# Patient Record
Sex: Female | Born: 1955 | Race: White | Hispanic: No | Marital: Married | Smoking: Never smoker
Health system: Southern US, Community
[De-identification: ages and names within clinical notes are randomized; demographics above are authoritative.]

## PROBLEM LIST (undated history)

## (undated) DIAGNOSIS — M810 Age-related osteoporosis without current pathological fracture: Secondary | ICD-10-CM

## (undated) DIAGNOSIS — E039 Hypothyroidism, unspecified: Secondary | ICD-10-CM

## (undated) HISTORY — DX: Age-related osteoporosis without current pathological fracture: M81.0

## (undated) HISTORY — DX: Hypothyroidism, unspecified: E03.9

---
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
## 1997-05-06 HISTORY — PX: CHOLECYSTECTOMY: SHX55

## 2000-10-09 ENCOUNTER — Encounter: Payer: Self-pay | Admitting: Family Medicine

## 2000-10-09 ENCOUNTER — Ambulatory Visit (HOSPITAL_COMMUNITY): Admission: RE | Admit: 2000-10-09 | Discharge: 2000-10-09 | Payer: Self-pay | Admitting: *Deleted

## 2001-03-17 ENCOUNTER — Other Ambulatory Visit: Admission: RE | Admit: 2001-03-17 | Discharge: 2001-03-17 | Payer: Self-pay | Admitting: Obstetrics and Gynecology

## 2001-09-03 ENCOUNTER — Ambulatory Visit (HOSPITAL_COMMUNITY): Admission: RE | Admit: 2001-09-03 | Discharge: 2001-09-03 | Payer: Self-pay | Admitting: Family Medicine

## 2001-09-03 ENCOUNTER — Encounter: Payer: Self-pay | Admitting: Family Medicine

## 2001-10-12 ENCOUNTER — Encounter: Payer: Self-pay | Admitting: Family Medicine

## 2001-10-12 ENCOUNTER — Ambulatory Visit (HOSPITAL_COMMUNITY): Admission: RE | Admit: 2001-10-12 | Discharge: 2001-10-12 | Payer: Self-pay | Admitting: Family Medicine

## 2001-11-25 ENCOUNTER — Ambulatory Visit (HOSPITAL_COMMUNITY): Admission: RE | Admit: 2001-11-25 | Discharge: 2001-11-25 | Payer: Self-pay | Admitting: Endocrinology

## 2002-10-14 ENCOUNTER — Ambulatory Visit (HOSPITAL_COMMUNITY): Admission: RE | Admit: 2002-10-14 | Discharge: 2002-10-14 | Payer: Self-pay | Admitting: Family Medicine

## 2002-10-14 ENCOUNTER — Encounter: Payer: Self-pay | Admitting: Family Medicine

## 2003-10-07 ENCOUNTER — Encounter (HOSPITAL_COMMUNITY): Admission: RE | Admit: 2003-10-07 | Discharge: 2003-11-06 | Payer: Self-pay | Admitting: Orthopaedic Surgery

## 2003-10-19 ENCOUNTER — Ambulatory Visit (HOSPITAL_COMMUNITY): Admission: RE | Admit: 2003-10-19 | Discharge: 2003-10-19 | Payer: Self-pay | Admitting: Family Medicine

## 2003-11-04 ENCOUNTER — Ambulatory Visit (HOSPITAL_COMMUNITY): Admission: RE | Admit: 2003-11-04 | Discharge: 2003-11-04 | Payer: Self-pay | Admitting: Orthopaedic Surgery

## 2003-11-04 ENCOUNTER — Encounter: Payer: Self-pay | Admitting: Orthopaedic Surgery

## 2004-10-22 ENCOUNTER — Ambulatory Visit (HOSPITAL_COMMUNITY): Admission: RE | Admit: 2004-10-22 | Discharge: 2004-10-22 | Payer: Self-pay | Admitting: Family Medicine

## 2005-10-23 ENCOUNTER — Ambulatory Visit (HOSPITAL_COMMUNITY): Admission: RE | Admit: 2005-10-23 | Discharge: 2005-10-23 | Payer: Self-pay | Admitting: Family Medicine

## 2006-10-27 ENCOUNTER — Ambulatory Visit (HOSPITAL_COMMUNITY): Admission: RE | Admit: 2006-10-27 | Discharge: 2006-10-27 | Payer: Self-pay | Admitting: Family Medicine

## 2007-05-15 ENCOUNTER — Ambulatory Visit (HOSPITAL_COMMUNITY): Admission: RE | Admit: 2007-05-15 | Discharge: 2007-05-15 | Payer: Self-pay | Admitting: Gastroenterology

## 2007-05-15 ENCOUNTER — Ambulatory Visit: Payer: Self-pay | Admitting: Gastroenterology

## 2007-10-28 ENCOUNTER — Ambulatory Visit (HOSPITAL_COMMUNITY): Admission: RE | Admit: 2007-10-28 | Discharge: 2007-10-28 | Payer: Self-pay | Admitting: Family Medicine

## 2007-11-23 ENCOUNTER — Ambulatory Visit (HOSPITAL_COMMUNITY): Admission: RE | Admit: 2007-11-23 | Discharge: 2007-11-23 | Payer: Self-pay | Admitting: Family Medicine

## 2008-03-08 ENCOUNTER — Ambulatory Visit (HOSPITAL_COMMUNITY): Admission: RE | Admit: 2008-03-08 | Discharge: 2008-03-08 | Payer: Self-pay | Admitting: Family Medicine

## 2008-11-01 ENCOUNTER — Ambulatory Visit (HOSPITAL_COMMUNITY): Admission: RE | Admit: 2008-11-01 | Discharge: 2008-11-01 | Payer: Self-pay | Admitting: Family Medicine

## 2009-06-12 ENCOUNTER — Encounter: Payer: Self-pay | Admitting: Orthopedic Surgery

## 2009-06-19 ENCOUNTER — Ambulatory Visit: Payer: Self-pay | Admitting: Orthopedic Surgery

## 2009-06-19 DIAGNOSIS — G5600 Carpal tunnel syndrome, unspecified upper limb: Secondary | ICD-10-CM | POA: Insufficient documentation

## 2009-06-19 DIAGNOSIS — M7580 Other shoulder lesions, unspecified shoulder: Secondary | ICD-10-CM

## 2009-06-19 DIAGNOSIS — M25819 Other specified joint disorders, unspecified shoulder: Secondary | ICD-10-CM | POA: Insufficient documentation

## 2009-11-02 ENCOUNTER — Ambulatory Visit (HOSPITAL_COMMUNITY): Admission: RE | Admit: 2009-11-02 | Discharge: 2009-11-02 | Payer: Self-pay | Admitting: Family Medicine

## 2010-05-27 ENCOUNTER — Encounter: Payer: Self-pay | Admitting: Family Medicine

## 2010-06-05 NOTE — Assessment & Plan Note (Signed)
Summary: RT SHOULDER PAIN RADIATES DOWNW/NEEDS XRAY/UMR/CAF   Vital Signs:  Patient profile:   55 year old female Height:      63 inches Temp:     98.7 degrees F Pulse rate:   72 / minute  Visit Type:  new  CC:  right shoulder pain.[name]  History of Present Illness: I saw Sandra Zavala in the office today for an initial visit.  She is a 55 years old woman with the complaint of:  RIGHT SHOULDER PAIN.  The patient complains of RIGHT shoulder pain and some numbness in her RIGHT hand.  She is having throbbing pain which is a 7 on a scale of 10 it is intermittent it happens with no specific temporal findings.  It was gradual in onset it is improved with limited movement and worse with reaching overhead she does complain of numbness and tingling in the RIGHT hand which is worse at night  She has a history of a bulging disc diagnosed in 2005 that was treated with exercise  The pain has been present for 3-4 months it stays above the elbow she denies any neck pain  Xrays today.  Allergies (verified): No Known Drug Allergies  Past History:  Past Medical History: Thyroid Osteoporosis  Past Surgical History: Cholecystectomy Tonsillectomy  Review of Systems General:  Denies weight loss, weight gain, fever, chills, and fatigue. Cardiac :  Denies chest pain, angina, heart attack, heart failure, poor circulation, blood clots, and phlebitis. Resp:  Denies short of breath, difficulty breathing, COPD, cough, and pneumonia. GI:  Denies nausea, vomiting, diarrhea, constipation, difficulty swallowing, ulcers, GERD, and reflux. GU:  Denies kidney failure, kidney transplant, kidney stones, burning, poor stream, testicular cancer, blood in urine, and . Neuro:  Complains of numbness; denies headache, dizziness, migraines, weakness, tremor, and unsteady walking; tingling. MS:  Denies joint pain, rheumatoid arthritis, joint swelling, gout, bone cancer, osteoporosis, and . Endo:  Denies thyroid disease,  goiter, and diabetes. Psych:  Denies depression, mood swings, anxiety, panic attack, bipolar, and schizophrenia. Derm:  Denies eczema, cancer, and itching. EENT:  Denies poor vision, cataracts, glaucoma, poor hearing, vertigo, ears ringing, sinusitis, hoarseness, toothaches, and bleeding gums. Immunology:  Denies seasonal allergies, sinus problems, and allergic to bee stings. Lymphatic:  Denies lymph node cancer and lymph edema.  Physical Exam  Additional Exam:  GEN: well developed, well nourished, normal grooming and hygiene, no deformity and normal body habitus.   CDV: pulses are normal, no edema, no erythema. no tenderness  Lymph: normal lymph nodes   Skin: no rashes, skin lesions or open sores   NEURO: normal coordination, reflexes, sensation.   Psyche: awake, alert and oriented. Mood normal   Gait: normal  Neck inspection reveals minimal to no tenderness with full range of motion normal muscle tone and no joint laxity  LEFT shoulder has full range of motion grade 5 strength no joint laxity and no tenderness  RIGHT shoulder is tender over the anterolateral deltoid with unrestricted motion but painful motion in extremes of forward elevation and internal rotation.  Has grade 5 strength.  She has a positive impingement sign.  She has no joint laxity.  The RIGHT upper extremity there is tenderness over the carpal tunnel a positive carpal tunnel compression test and decreased sensation is noted with the compression test in the thumb long and index finger     Impression & Recommendations:  Problem # 1:  IMPINGEMENT SYNDROME (ICD-726.2) Assessment New  RIGHT shoulder injection,   Verbal consent  obtained/The shoulder was injected with depomedrol 40mg /cc and sensorcaine .25% . There were no complications  AP lateral RIGHT shoulder 2 views of the glenohumeral joint   The glenohumeral joint space is normal. The bony anatomy is without bone lesion. The acromion is a Type  II Normal shoulder  Orders: New Patient Level IV (98119)  Problem # 2:  CARPAL TUNNEL SYNDROME (ICD-354.0) Assessment: New  recommend carpal tunnel splint  Neurontin 100 mg q.h.s.  Orders: New Patient Level IV (14782) Shoulder x-ray,  minimum 2 views (95621)  Patient Instructions: 1)  Carpal tunnel splint at night  2)  You have received an injection of cortisone today. You may experience increased pain at the injection site. Apply ice pack to the area for 20 minutes every 2 hours and take 2 xtra strength tylenol every 8 hours. This increased pain will usually resolve in 24 hours. The injection will take effect in 3-10 days.  3)  Limit activity to comfort and avoid activities that increase discomfort.  Apply moist heat and/or ice to shoulder and take medication as instructed for pain relief. Please read the Shoulder Pain Handout and start exercises as directed  4)  Please schedule a follow-up appointment in 2 months. [cancel if you are not having any more pain]

## 2010-06-05 NOTE — Letter (Signed)
Summary: Historic Patient File  Historic Patient File   Imported By: Elvera Maria 06/27/2009 11:21:52  _____________________________________________________________________  External Attachment:    Type:   Image     Comment:   history form

## 2010-09-18 NOTE — Op Note (Signed)
NAME:  Sandra Zavala, Sandra Zavala                ACCOUNT NO.:  [id number]   MEDICAL RECORD NO.:  [id number]          PATIENT TYPE:  AMB   LOCATION:  DAY                           FACILITY:  [facility]   PHYSICIAN:  Kassie Mends, M.D.      DATE OF BIRTH:  26-Mar-1956   DATE OF PROCEDURE:  05/15/2007  DATE OF DISCHARGE:                               OPERATIVE REPORT   REFERRING PHYSICIAN:  Lilyan Punt, MD   PROCEDURE:  Colonoscopy.   INDICATION FOR EXAM:  Sandra Zavala is a 55 year old female who presents  for average risk colon cancer screening.   FINDINGS:  Rare sigmoid diverticula.  Small internal hemorrhoids.  She  required multiple changes in position as well as abdominal pressure to  achieve successful intubation of the cecum.  Otherwise, no polyps,  masses, inflammatory changes or AVMs seen.   RECOMMENDATIONS:  1. Screening colonoscopy in 10 years.  2. She should follow a high fiber diet.  She was given a handout on a      high-fiber diet and diverticulosis.   MEDICATIONS:  1. Demerol 75 mg IV.  2. Versed 5 mg IV.   PROCEDURE TECHNIQUE:  Physical exam was performed.  Informed consent was  obtained from the patient after explaining the benefits, risks and  alternatives to the procedure.  The patient was connected to the monitor  and placed in the left lateral position.  Continuous oxygen was provided  by nasal cannula and IV medicine administered through an indwelling  cannula.  After administration of sedation and rectal exam, the  patient's rectum was intubated.  The scope was advanced under direct  visualization to the cecum.  The scope was removed slowly by carefully  examining the color, texture, anatomy and integrity of the mucosa on the  way out.  Sandra Zavala has an angulation at  the rectosigmoid area which required her to be in the supine position  during the majority of the colonoscopy in order to successfully intubate  the cecum. The patient was recovered in endoscopy and discharged  home in  satisfactory condition.      Kassie Mends, M.D.  Electronically Signed     SM/MEDQ  D:  05/15/2007  T:  05/15/2007  Job:  [id number]   cc:   Lorin Picket A. Gerda Diss, MD  Fax: [phone]

## 2010-09-24 ENCOUNTER — Other Ambulatory Visit: Payer: Self-pay | Admitting: Nurse Practitioner

## 2010-09-24 DIAGNOSIS — Z139 Encounter for screening, unspecified: Secondary | ICD-10-CM

## 2010-11-06 ENCOUNTER — Ambulatory Visit (HOSPITAL_COMMUNITY)
Admission: RE | Admit: 2010-11-06 | Discharge: 2010-11-06 | Disposition: A | Discharge status: Home / Self Care | Payer: 59 | Source: Ambulatory Visit | Attending: Nurse Practitioner | Admitting: Nurse Practitioner

## 2010-11-06 DIAGNOSIS — Z1231 Encounter for screening mammogram for malignant neoplasm of breast: Secondary | ICD-10-CM | POA: Insufficient documentation

## 2010-11-06 DIAGNOSIS — Z139 Encounter for screening, unspecified: Secondary | ICD-10-CM

## 2011-06-18 ENCOUNTER — Other Ambulatory Visit: Payer: Self-pay | Admitting: Family Medicine

## 2011-06-24 ENCOUNTER — Other Ambulatory Visit (HOSPITAL_COMMUNITY): Payer: 59

## 2011-06-25 ENCOUNTER — Other Ambulatory Visit (HOSPITAL_COMMUNITY): Payer: 59

## 2011-06-27 ENCOUNTER — Ambulatory Visit (HOSPITAL_COMMUNITY)
Admission: RE | Admit: 2011-06-27 | Discharge: 2011-06-27 | Disposition: A | Discharge status: Home / Self Care | Payer: 59 | Source: Ambulatory Visit | Attending: Family Medicine | Admitting: Family Medicine

## 2011-06-27 DIAGNOSIS — M818 Other osteoporosis without current pathological fracture: Secondary | ICD-10-CM | POA: Insufficient documentation

## 2011-06-27 DIAGNOSIS — Z780 Asymptomatic menopausal state: Secondary | ICD-10-CM | POA: Insufficient documentation

## 2011-10-08 ENCOUNTER — Other Ambulatory Visit: Payer: Self-pay | Admitting: Nurse Practitioner

## 2011-10-08 DIAGNOSIS — Z139 Encounter for screening, unspecified: Secondary | ICD-10-CM

## 2011-11-11 ENCOUNTER — Ambulatory Visit (HOSPITAL_COMMUNITY)
Admission: RE | Admit: 2011-11-11 | Discharge: 2011-11-11 | Disposition: A | Discharge status: Home / Self Care | Payer: 59 | Source: Ambulatory Visit | Attending: Nurse Practitioner | Admitting: Nurse Practitioner

## 2011-11-11 DIAGNOSIS — Z139 Encounter for screening, unspecified: Secondary | ICD-10-CM

## 2011-11-11 DIAGNOSIS — Z1231 Encounter for screening mammogram for malignant neoplasm of breast: Secondary | ICD-10-CM | POA: Insufficient documentation

## 2012-10-27 ENCOUNTER — Other Ambulatory Visit: Payer: Self-pay | Admitting: Nurse Practitioner

## 2012-10-27 DIAGNOSIS — Z139 Encounter for screening, unspecified: Secondary | ICD-10-CM

## 2012-11-12 ENCOUNTER — Ambulatory Visit (HOSPITAL_COMMUNITY)
Admission: RE | Admit: 2012-11-12 | Discharge: 2012-11-12 | Disposition: A | Discharge status: Home / Self Care | Payer: 59 | Source: Ambulatory Visit | Attending: Nurse Practitioner | Admitting: Nurse Practitioner

## 2012-11-12 DIAGNOSIS — Z139 Encounter for screening, unspecified: Secondary | ICD-10-CM

## 2012-11-12 DIAGNOSIS — Z1231 Encounter for screening mammogram for malignant neoplasm of breast: Secondary | ICD-10-CM | POA: Insufficient documentation

## 2012-11-13 ENCOUNTER — Other Ambulatory Visit: Payer: Self-pay | Admitting: Nurse Practitioner

## 2012-12-02 ENCOUNTER — Encounter: Payer: Self-pay | Admitting: Nurse Practitioner

## 2012-12-02 ENCOUNTER — Ambulatory Visit (INDEPENDENT_AMBULATORY_CARE_PROVIDER_SITE_OTHER): Payer: 59 | Admitting: Nurse Practitioner

## 2012-12-02 VITALS — BP 130/84 | Ht 59.25 in | Wt 178.4 lb

## 2012-12-02 DIAGNOSIS — R300 Dysuria: Secondary | ICD-10-CM

## 2012-12-02 DIAGNOSIS — Z0000 Encounter for general adult medical examination without abnormal findings: Secondary | ICD-10-CM

## 2012-12-02 DIAGNOSIS — M810 Age-related osteoporosis without current pathological fracture: Secondary | ICD-10-CM

## 2012-12-02 DIAGNOSIS — Z01419 Encounter for gynecological examination (general) (routine) without abnormal findings: Secondary | ICD-10-CM

## 2012-12-02 DIAGNOSIS — E039 Hypothyroidism, unspecified: Secondary | ICD-10-CM

## 2012-12-02 LAB — POCT URINALYSIS DIPSTICK: pH, UA: 7

## 2012-12-02 LAB — POCT UA - MICROSCOPIC ONLY: Mucus, UA: 0

## 2012-12-02 MED ORDER — SULFAMETHOXAZOLE-TMP DS 800-160 MG PO TABS: 1.0000 | ORAL_TABLET | Freq: Two times a day (BID) | ORAL | Status: DC

## 2012-12-02 NOTE — Patient Instructions (Signed)
Luvena 2-3 x per week for vaginal health

## 2012-12-02 NOTE — Progress Notes (Addendum)
  Subjective:    Patient ID: Sandra Zavala, female    DOB: 1955-08-10, 57 y.o.   MRN: [id number]  HPI  presents for her wellness checkup. Just had her mammogram done. Her last bone density was 06/27/2011. Regular eye exams. Regular dental exams. No vaginal discharge pelvic pain or fever. Same sexual partner. No vaginal bleeding. Complaints of left pelvic/flank pain for the past few weeks. Has noticed a change in her urinary stream at times. No gross hematuria. Symptoms occur about several times per day but not every day. Wonders if it could be a kidney stone. At this time no pain consistent with an active kidney stone.    Review of Systems  Constitutional: Negative for fever, activity change, appetite change and fatigue.  HENT: Negative for hearing loss, sore throat, rhinorrhea, dental problem and postnasal drip.   Eyes: Negative for visual disturbance.  Respiratory: Negative for chest tightness and shortness of breath.   Cardiovascular: Negative for chest pain, palpitations and leg swelling.  Gastrointestinal: Negative for nausea, vomiting, diarrhea, constipation, blood in stool and abdominal distention.  Genitourinary: Negative for dysuria, urgency, frequency, vaginal bleeding, vaginal discharge, enuresis, difficulty urinating, menstrual problem and pelvic pain.  Neurological: Negative for headaches.  Psychiatric/Behavioral: Negative for sleep disturbance and dysphoric mood. The patient is not nervous/anxious.        Objective:   Physical Exam  Vitals reviewed. Constitutional: She is oriented to person, place, and time. She appears well-developed. No distress.  HENT:  Right Ear: External ear normal.  Left Ear: External ear normal.  Mouth/Throat: Oropharynx is clear and moist.  Neck: Normal range of motion. Neck supple. No tracheal deviation present. No thyromegaly present.  Cardiovascular: Normal rate, regular rhythm and normal heart sounds.  Exam reveals no gallop.   No murmur  heard. Pulmonary/Chest: Effort normal and breath sounds normal.  Abdominal: Soft. She exhibits no distension and no mass. There is tenderness. There is no rebound and no guarding.  Genitourinary: Uterus normal.  Musculoskeletal: She exhibits no edema.  Lymphadenopathy:    She has no cervical adenopathy.  Neurological: She is alert and oriented to person, place, and time.  Skin: Skin is warm and dry. No rash noted.  Psychiatric: She has a normal mood and affect. Her behavior is normal.   Abdominal exam: Mild tenderness with deep palpation towards the left lower quadrant and into the anterior flank area. Minimal left CVA area tenderness. External GU normal limit. Bimanual exam normal limit. Rectal exam normal, no stool for Hemoccult. Urine microscopic 5-10 wbc's, rare epithelial cell. Breast exam: no masses noted; axillae no adenopathy.        Assessment & Plan:  Well woman exam - Plan: POC Hemoccult Bld/Stl (3-Cd Home Screen)  Dysuria - Plan: POCT urinalysis dipstick, POCT UA - Microscopic Only, Urine culture  Osteoporosis, unspecified - Plan: Vitamin D 25 hydroxy  Hypothyroidism - Plan: TSH  Routine general medical examination at a health care facility - Plan: Basic metabolic panel, Hepatic function panel, Lipid panel  Continue vitamin D supplementation. Bactrim DS 1 by mouth twice a day x7 days. Urine sent for culture and sensitivity. Recommend regular exercise and healthy diet. Recheck if abdominal symptoms persist.

## 2012-12-04 ENCOUNTER — Encounter: Payer: Self-pay | Admitting: Nurse Practitioner

## 2012-12-04 LAB — LIPID PANEL
Cholesterol: 167 mg/dL (ref 0–200)
HDL: 55 mg/dL (ref >39)
VLDL: 13 mg/dL (ref 0–40)

## 2012-12-04 LAB — VITAMIN D 25 HYDROXY (VIT D DEFICIENCY, FRACTURES): Vit D, 25-Hydroxy: 64 ng/mL (ref 30–89)

## 2012-12-04 LAB — URINE CULTURE
Colony Count: NO GROWTH
Organism ID, Bacteria: NO GROWTH

## 2012-12-04 LAB — BASIC METABOLIC PANEL
BUN: 16 mg/dL (ref 6–23)
CO2: 26 mEq/L (ref 19–32)
Chloride: 105 mEq/L (ref 96–112)
Creat: 0.92 mg/dL (ref 0.50–1.10)

## 2012-12-04 LAB — HEPATIC FUNCTION PANEL: Alkaline Phosphatase: 96 U/L (ref 39–117)

## 2012-12-04 LAB — TSH: TSH: 2.995 u[IU]/mL (ref 0.350–4.500)

## 2012-12-05 ENCOUNTER — Encounter: Payer: Self-pay | Admitting: Nurse Practitioner

## 2012-12-05 DIAGNOSIS — M810 Age-related osteoporosis without current pathological fracture: Secondary | ICD-10-CM | POA: Insufficient documentation

## 2012-12-07 ENCOUNTER — Other Ambulatory Visit: Payer: Self-pay | Admitting: *Deleted

## 2012-12-07 MED ORDER — LEVOTHYROXINE SODIUM 125 MCG PO TABS: 125.0000 ug | ORAL_TABLET | Freq: Every day | ORAL | Status: DC

## 2012-12-24 ENCOUNTER — Other Ambulatory Visit: Payer: Self-pay | Admitting: Nurse Practitioner

## 2013-01-20 ENCOUNTER — Other Ambulatory Visit (INDEPENDENT_AMBULATORY_CARE_PROVIDER_SITE_OTHER): Payer: 59 | Admitting: *Deleted

## 2013-01-20 DIAGNOSIS — Z0000 Encounter for general adult medical examination without abnormal findings: Secondary | ICD-10-CM

## 2013-01-20 DIAGNOSIS — Z01419 Encounter for gynecological examination (general) (routine) without abnormal findings: Secondary | ICD-10-CM

## 2013-01-20 LAB — POC HEMOCCULT BLD/STL (HOME/3-CARD/SCREEN): Card #3 Fecal Occult Blood, POC: NEGATIVE

## 2013-01-26 ENCOUNTER — Ambulatory Visit (INDEPENDENT_AMBULATORY_CARE_PROVIDER_SITE_OTHER): Payer: 59 | Admitting: Urology

## 2013-01-26 DIAGNOSIS — R1032 Left lower quadrant pain: Secondary | ICD-10-CM

## 2013-01-26 DIAGNOSIS — N200 Calculus of kidney: Secondary | ICD-10-CM

## 2013-01-27 ENCOUNTER — Other Ambulatory Visit: Payer: Self-pay | Admitting: Urology

## 2013-01-27 DIAGNOSIS — N200 Calculus of kidney: Secondary | ICD-10-CM

## 2013-01-28 ENCOUNTER — Ambulatory Visit (HOSPITAL_COMMUNITY)
Admission: RE | Admit: 2013-01-28 | Discharge: 2013-01-28 | Disposition: A | Discharge status: Home / Self Care | Payer: 59 | Source: Ambulatory Visit | Attending: Urology | Admitting: Urology

## 2013-01-28 DIAGNOSIS — R109 Unspecified abdominal pain: Secondary | ICD-10-CM | POA: Insufficient documentation

## 2013-01-28 DIAGNOSIS — N200 Calculus of kidney: Secondary | ICD-10-CM

## 2013-02-22 ENCOUNTER — Encounter: Payer: Self-pay | Admitting: Nurse Practitioner

## 2013-02-22 ENCOUNTER — Ambulatory Visit (INDEPENDENT_AMBULATORY_CARE_PROVIDER_SITE_OTHER): Payer: 59 | Admitting: Nurse Practitioner

## 2013-02-22 VITALS — BP 132/88 | Temp 98.6°F | Ht 60.0 in | Wt 178.0 lb

## 2013-02-22 DIAGNOSIS — J309 Allergic rhinitis, unspecified: Secondary | ICD-10-CM

## 2013-02-22 DIAGNOSIS — J329 Chronic sinusitis, unspecified: Secondary | ICD-10-CM

## 2013-02-22 DIAGNOSIS — J300 Vasomotor rhinitis: Secondary | ICD-10-CM

## 2013-02-22 MED ORDER — AMOXICILLIN-POT CLAVULANATE 875-125 MG PO TABS: 1.0000 | ORAL_TABLET | Freq: Two times a day (BID) | ORAL | Status: DC

## 2013-02-22 MED ORDER — METHYLPREDNISOLONE ACETATE 40 MG/ML IJ SUSP
40.0000 mg | Freq: Once | INTRAMUSCULAR | Status: AC
  Administered 2013-02-22: 40 mg via INTRAMUSCULAR

## 2013-02-22 NOTE — Patient Instructions (Signed)
Nasacort AQ as directed 

## 2013-02-26 ENCOUNTER — Encounter: Payer: Self-pay | Admitting: Nurse Practitioner

## 2013-02-26 NOTE — Progress Notes (Signed)
Subjective:  Presents with complaints of head congestion and runny nose for the past 6 weeks. Over the past several days has developed symptoms of a sinus infection. No fever. Facial area headache. Occasional cough. No wheezing. Ear pain. Sore throat mainly at night and first thing in the morning. No relief with OTC meds.  Objective:   BP 132/88  Temp(Src) 98.6 F (37 C) (Oral)  Ht 5' (1.524 m)  Wt 178 lb (80.74 kg)  BMI 34.76 kg/m2 NAD. Alert, oriented. TMs retracted, no erythema. Pharynx injected with PND noted. Neck supple with mild soft slightly tender adenopathy. Lungs clear. Heart regular rate rhythm.  Assessment:Vasomotor rhinitis - Plan: methylPREDNISolone acetate (DEPO-MEDROL) injection 40 mg  Rhinosinusitis  Plan: Meds ordered this encounter  Medications  . amoxicillin-clavulanate (AUGMENTIN) 875-125 MG per tablet    Sig: Take 1 tablet by mouth 2 (two) times daily.    Dispense:  20 tablet    Refill:  0    Order Specific Question:  Supervising Provider    Answer:  Merlyn Albert [[id number]]  . methylPREDNISolone acetate (DEPO-MEDROL) injection 40 mg    Sig:    OTC antihistamines and Nasacort AQ as directed. Call back by the end of the week if no improvement, sooner if worse.

## 2013-03-04 ENCOUNTER — Encounter: Payer: Self-pay | Admitting: Nurse Practitioner

## 2013-03-08 ENCOUNTER — Encounter: Payer: Self-pay | Admitting: Nurse Practitioner

## 2013-03-08 ENCOUNTER — Other Ambulatory Visit: Payer: Self-pay | Admitting: Nurse Practitioner

## 2013-03-08 NOTE — Telephone Encounter (Signed)
[place] bilat axillary for masses

## 2013-03-09 ENCOUNTER — Other Ambulatory Visit: Payer: Self-pay | Admitting: *Deleted

## 2013-03-09 ENCOUNTER — Ambulatory Visit (HOSPITAL_COMMUNITY): Payer: 59

## 2013-03-09 ENCOUNTER — Ambulatory Visit (HOSPITAL_COMMUNITY)
Admission: RE | Admit: 2013-03-09 | Discharge: 2013-03-09 | Disposition: A | Discharge status: Home / Self Care | Payer: 59 | Source: Ambulatory Visit | Attending: Nurse Practitioner | Admitting: Nurse Practitioner

## 2013-03-09 DIAGNOSIS — R229 Localized swelling, mass and lump, unspecified: Secondary | ICD-10-CM | POA: Insufficient documentation

## 2013-03-09 DIAGNOSIS — R2233 Localized swelling, mass and lump, upper limb, bilateral: Secondary | ICD-10-CM

## 2013-03-10 ENCOUNTER — Encounter: Payer: Self-pay | Admitting: Nurse Practitioner

## 2013-03-11 ENCOUNTER — Other Ambulatory Visit: Payer: Self-pay

## 2013-04-14 ENCOUNTER — Other Ambulatory Visit: Payer: Self-pay | Admitting: Nurse Practitioner

## 2013-05-18 ENCOUNTER — Telehealth: Payer: Self-pay | Admitting: Family Medicine

## 2013-05-18 MED ORDER — ALENDRONATE SODIUM 70 MG PO TABS: ORAL_TABLET | ORAL | Status: DC

## 2013-05-18 NOTE — Telephone Encounter (Signed)
Patient needs Rx for alendronate 70 mg 1 weekly, would like 3 month supply [place]

## 2013-05-18 NOTE — Telephone Encounter (Signed)
Medication sent to pharmacy and patient was notified. 

## 2013-07-21 ENCOUNTER — Ambulatory Visit (INDEPENDENT_AMBULATORY_CARE_PROVIDER_SITE_OTHER): Payer: 59 | Admitting: Nurse Practitioner

## 2013-07-21 ENCOUNTER — Encounter: Payer: Self-pay | Admitting: Nurse Practitioner

## 2013-07-21 VITALS — BP 128/82 | Temp 98.2°F | Ht 60.0 in | Wt 181.0 lb

## 2013-07-21 DIAGNOSIS — J322 Chronic ethmoidal sinusitis: Secondary | ICD-10-CM

## 2013-07-21 MED ORDER — AMOXICILLIN-POT CLAVULANATE 875-125 MG PO TABS: 1.0000 | ORAL_TABLET | Freq: Two times a day (BID) | ORAL | Status: DC

## 2013-07-21 MED ORDER — PREDNISONE 20 MG PO TABS: ORAL_TABLET | ORAL | Status: DC

## 2013-07-21 MED ORDER — METHYLPREDNISOLONE ACETATE 40 MG/ML IJ SUSP
40.0000 mg | Freq: Once | INTRAMUSCULAR | Status: AC
  Administered 2013-07-21: 40 mg via INTRAMUSCULAR

## 2013-07-26 ENCOUNTER — Encounter: Payer: Self-pay | Admitting: Nurse Practitioner

## 2013-07-26 NOTE — Progress Notes (Signed)
Subjective:  Presents for complaints of sinus symptoms over the past week. No fever but having chills. Ethmoid sinus area headache. No cough. Intense head congestion. Producing yellow green mucus for the past 3 days. No sore throat. Pressure in the right ear. No relief with Nasacort and Mucinex.  Objective:   BP 128/82  Temp(Src) 98.2 F (36.8 C)  Ht 5' (1.524 m)  Wt 181 lb (82.101 kg)  BMI 35.35 kg/m2 NAD. Alert, oriented. TMs clear effusion, no erythema. Nasal mucosa pale and boggy. Pharynx injected with PND noted. Neck supple with mild soft  adenopathy. Lungs clear. Heart regular rate rhythm.  Assessment:Ethmoid sinusitis - Plan: methylPREDNISolone acetate (DEPO-MEDROL) injection 40 mg  Plan: Meds ordered this encounter  Medications  . amoxicillin-clavulanate (AUGMENTIN) 875-125 MG per tablet    Sig: Take 1 tablet by mouth 2 (two) times daily.    Dispense:  20 tablet    Refill:  0    Order Specific Question:  Supervising Provider    Answer:  Mikey Kirschner [[id number]]  . predniSONE (DELTASONE) 20 MG tablet    Sig: 3 po qd x 3 d then 2 po qd x 3 d then 1 po qd x 3 d    Dispense:  18 tablet    Refill:  0    Order Specific Question:  Supervising Provider    Answer:  Mikey Kirschner [[id number]]  . methylPREDNISolone acetate (DEPO-MEDROL) injection 40 mg    Sig:    continue Nasacort AQ and Mucinex as directed. Add daily antihistamine. Call back next week if no improvement, sooner if worse.

## 2013-09-09 ENCOUNTER — Other Ambulatory Visit: Payer: Self-pay | Admitting: Family Medicine

## 2013-10-03 ENCOUNTER — Other Ambulatory Visit: Payer: Self-pay | Admitting: Family Medicine

## 2013-10-21 ENCOUNTER — Other Ambulatory Visit: Payer: Self-pay | Admitting: Nurse Practitioner

## 2013-10-21 DIAGNOSIS — Z1231 Encounter for screening mammogram for malignant neoplasm of breast: Secondary | ICD-10-CM

## 2013-10-29 ENCOUNTER — Other Ambulatory Visit: Payer: Self-pay | Admitting: Nurse Practitioner

## 2013-10-29 MED ORDER — ALENDRONATE SODIUM 70 MG PO TABS: ORAL_TABLET | ORAL | Status: DC

## 2013-11-02 ENCOUNTER — Other Ambulatory Visit: Payer: Self-pay

## 2013-11-02 MED ORDER — ALENDRONATE SODIUM 70 MG PO TABS: ORAL_TABLET | ORAL | Status: DC

## 2013-11-10 ENCOUNTER — Other Ambulatory Visit: Payer: Self-pay | Admitting: Nurse Practitioner

## 2013-11-10 MED ORDER — ALENDRONATE SODIUM 70 MG PO TABS: ORAL_TABLET | ORAL | Status: DC

## 2013-11-17 ENCOUNTER — Ambulatory Visit (HOSPITAL_COMMUNITY)
Admission: RE | Admit: 2013-11-17 | Discharge: 2013-11-17 | Disposition: A | Discharge status: Home / Self Care | Payer: 59 | Source: Ambulatory Visit | Attending: Nurse Practitioner | Admitting: Nurse Practitioner

## 2013-11-17 DIAGNOSIS — Z1231 Encounter for screening mammogram for malignant neoplasm of breast: Secondary | ICD-10-CM | POA: Insufficient documentation

## 2013-12-06 ENCOUNTER — Encounter: Payer: 59 | Admitting: Nurse Practitioner

## 2013-12-23 ENCOUNTER — Encounter: Payer: Self-pay | Admitting: Nurse Practitioner

## 2013-12-23 ENCOUNTER — Ambulatory Visit (INDEPENDENT_AMBULATORY_CARE_PROVIDER_SITE_OTHER): Payer: 59 | Admitting: Nurse Practitioner

## 2013-12-23 VITALS — BP 124/82 | Ht 60.0 in | Wt 181.0 lb

## 2013-12-23 DIAGNOSIS — M65849 Other synovitis and tenosynovitis, unspecified hand: Secondary | ICD-10-CM

## 2013-12-23 DIAGNOSIS — M810 Age-related osteoporosis without current pathological fracture: Secondary | ICD-10-CM

## 2013-12-23 DIAGNOSIS — M779 Enthesopathy, unspecified: Secondary | ICD-10-CM

## 2013-12-23 DIAGNOSIS — M778 Other enthesopathies, not elsewhere classified: Secondary | ICD-10-CM

## 2013-12-23 DIAGNOSIS — Z780 Asymptomatic menopausal state: Secondary | ICD-10-CM

## 2013-12-23 DIAGNOSIS — Z79899 Other long term (current) drug therapy: Secondary | ICD-10-CM

## 2013-12-23 DIAGNOSIS — E039 Hypothyroidism, unspecified: Secondary | ICD-10-CM

## 2013-12-23 DIAGNOSIS — Z0000 Encounter for general adult medical examination without abnormal findings: Secondary | ICD-10-CM

## 2013-12-23 DIAGNOSIS — M65839 Other synovitis and tenosynovitis, unspecified forearm: Secondary | ICD-10-CM

## 2013-12-23 DIAGNOSIS — N951 Menopausal and female climacteric states: Secondary | ICD-10-CM

## 2013-12-23 DIAGNOSIS — Z01419 Encounter for gynecological examination (general) (routine) without abnormal findings: Secondary | ICD-10-CM

## 2013-12-24 LAB — TSH: TSH: 1.087 u[IU]/mL (ref 0.350–4.500)

## 2013-12-24 LAB — BASIC METABOLIC PANEL
BUN: 14 mg/dL (ref 6–23)
CO2: 27 mEq/L (ref 19–32)
Calcium: 9 mg/dL (ref 8.4–10.5)
Chloride: 106 mEq/L (ref 96–112)
Creat: 0.56 mg/dL (ref 0.50–1.10)
GLUCOSE: 99 mg/dL (ref 70–99)
POTASSIUM: 4.6 meq/L (ref 3.5–5.3)
SODIUM: 144 meq/L (ref 135–145)

## 2013-12-24 LAB — LIPID PANEL
CHOL/HDL RATIO: 3.3 ratio
Cholesterol: 164 mg/dL (ref 0–200)
HDL: 50 mg/dL (ref >39)
LDL CALC: 102 mg/dL — AB (ref 0–99)
Triglycerides: 62 mg/dL (ref <150)
VLDL: 12 mg/dL (ref 0–40)

## 2013-12-24 LAB — HEPATIC FUNCTION PANEL
ALT: 34 U/L (ref 0–35)
AST: 19 U/L (ref 0–37)
Albumin: 4.3 g/dL (ref 3.5–5.2)
Alkaline Phosphatase: 96 U/L (ref 39–117)
BILIRUBIN DIRECT: 0.1 mg/dL (ref 0.0–0.3)
BILIRUBIN TOTAL: 0.4 mg/dL (ref 0.2–1.2)
Indirect Bilirubin: 0.3 mg/dL (ref 0.2–1.2)
Total Protein: 6 g/dL (ref 6.0–8.3)

## 2013-12-25 LAB — VITAMIN D 25 HYDROXY (VIT D DEFICIENCY, FRACTURES): Vit D, 25-Hydroxy: 66 ng/mL (ref 30–89)

## 2013-12-27 ENCOUNTER — Encounter: Payer: Self-pay | Admitting: Nurse Practitioner

## 2013-12-28 ENCOUNTER — Ambulatory Visit (HOSPITAL_COMMUNITY)
Admission: RE | Admit: 2013-12-28 | Discharge: 2013-12-28 | Disposition: A | Discharge status: Home / Self Care | Payer: 59 | Source: Ambulatory Visit | Attending: Nurse Practitioner | Admitting: Nurse Practitioner

## 2013-12-28 DIAGNOSIS — M810 Age-related osteoporosis without current pathological fracture: Secondary | ICD-10-CM | POA: Diagnosis not present

## 2013-12-29 ENCOUNTER — Encounter: Payer: Self-pay | Admitting: Nurse Practitioner

## 2013-12-29 NOTE — Progress Notes (Signed)
   Subjective:    Patient ID: Sandra Zavala, female    DOB: 11-22-1955, 58 y.o.   MRN: [id number]  Gynecologic Exam The patient's pertinent negatives include no pelvic pain or vaginal discharge. Pertinent negatives include no abdominal pain, constipation, diarrhea, dysuria, fever, frequency, nausea, sore throat, urgency or vomiting.   presents for wellness checkup. Regular vision and dental exams. Takes vitamin D and calcium daily. No vaginal bleeding. Married, same sexual partner. Tenderness at base of both thumbs. No erythema. Chronic intermittent itching along outer labia.    Review of Systems  Constitutional: Negative for fever, activity change, appetite change and fatigue.  HENT: Positive for congestion and rhinorrhea. Negative for dental problem, ear pain, sinus pressure and sore throat.   Respiratory: Negative for cough, chest tightness, shortness of breath and wheezing.   Cardiovascular: Negative for chest pain and leg swelling.  Gastrointestinal: Negative for nausea, vomiting, abdominal pain, diarrhea, constipation, blood in stool and abdominal distention.  Genitourinary: Negative for dysuria, urgency, frequency, vaginal bleeding, vaginal discharge, enuresis, difficulty urinating, genital sores and pelvic pain.  Musculoskeletal:       Pain at base of thumbs; more on the right.  Skin:       Chronic itching outer labia. No specific rash.       Objective:   Physical Exam  Vitals reviewed. Constitutional: She is oriented to person, place, and time. She appears well-developed. No distress.  HENT:  Right Ear: External ear normal.  Left Ear: External ear normal.  Mouth/Throat: Oropharynx is clear and moist.  Neck: Normal range of motion. Neck supple. No tracheal deviation present. No thyromegaly present.  Cardiovascular: Normal rate, regular rhythm and normal heart sounds.  Exam reveals no gallop.   No murmur heard. Pulmonary/Chest: Effort normal and breath sounds normal.    Abdominal: Soft. She exhibits no distension. There is no tenderness.  Genitourinary: Vagina normal and uterus normal. No vaginal discharge found.  External GU: linear area of hypopigmentation along the outer part of outer labia. No rash. Mild excoriation. Vagina: slightly pale; no discharge. No CMT. Bimanual exam: no tenderness or obvious masses.  Musculoskeletal: She exhibits no edema.  Tenderness at base of thumbs consistent with tendinitis  Lymphadenopathy:    She has no cervical adenopathy.  Neurological: She is alert and oriented to person, place, and time.  Skin: Skin is warm and dry. No rash noted.  Psychiatric: She has a normal mood and affect. Her behavior is normal.  breast exam: no masses; axillae no adenopathy        Assessment & Plan:  Well woman exam - Plan: POC Hemoccult Bld/Stl (3-Cd Home Screen), Lipid panel  Thumb tendonitis  Menopause; GU rash most likely related to this  Osteoporosis, unspecified - Plan: DG Bone Density, Vit D  25 hydroxy (rtn osteoporosis monitoring)  Unspecified hypothyroidism - Plan: TSH  Encounter for long-term (current) use of other medications - Plan: Hepatic function panel, Basic metabolic panel  Given Rx for topical progesterone 4% to use small amount to affected area daily as needed  Given Rx for thumb spica splint. Call back if persists, recommend ortho referral for possible injections. Recommend regular activity, healthy diet and continued weight loss effort.  Return in about 1 year (around 12/24/2014).

## 2013-12-30 ENCOUNTER — Other Ambulatory Visit: Payer: Self-pay | Admitting: *Deleted

## 2013-12-30 ENCOUNTER — Encounter: Payer: Self-pay | Admitting: Nurse Practitioner

## 2013-12-30 DIAGNOSIS — Z01419 Encounter for gynecological examination (general) (routine) without abnormal findings: Secondary | ICD-10-CM

## 2013-12-30 LAB — POC HEMOCCULT BLD/STL (HOME/3-CARD/SCREEN)
Card #2 Fecal Occult Blod, POC: NEGATIVE
Card #3 Fecal Occult Blood, POC: NEGATIVE
FECAL OCCULT BLD: NEGATIVE

## 2014-02-14 ENCOUNTER — Other Ambulatory Visit: Payer: Self-pay | Admitting: *Deleted

## 2014-02-14 ENCOUNTER — Encounter: Payer: Self-pay | Admitting: Nurse Practitioner

## 2014-02-14 MED ORDER — ALENDRONATE SODIUM 70 MG PO TABS: ORAL_TABLET | ORAL | Status: DC

## 2014-08-15 ENCOUNTER — Encounter: Payer: Self-pay | Admitting: Nurse Practitioner

## 2014-08-15 MED ORDER — ALENDRONATE SODIUM 70 MG PO TABS: ORAL_TABLET | ORAL | Status: DC

## 2014-10-31 ENCOUNTER — Other Ambulatory Visit: Payer: Self-pay | Admitting: Nurse Practitioner

## 2014-10-31 DIAGNOSIS — Z1231 Encounter for screening mammogram for malignant neoplasm of breast: Secondary | ICD-10-CM

## 2014-11-21 ENCOUNTER — Ambulatory Visit (HOSPITAL_COMMUNITY)
Admission: RE | Admit: 2014-11-21 | Discharge: 2014-11-21 | Disposition: A | Discharge status: Home / Self Care | Payer: 59 | Source: Ambulatory Visit | Attending: Nurse Practitioner | Admitting: Nurse Practitioner

## 2014-11-21 ENCOUNTER — Other Ambulatory Visit: Payer: Self-pay | Admitting: Nurse Practitioner

## 2014-11-21 ENCOUNTER — Encounter: Payer: Self-pay | Admitting: Nurse Practitioner

## 2014-11-21 DIAGNOSIS — Z1231 Encounter for screening mammogram for malignant neoplasm of breast: Secondary | ICD-10-CM | POA: Insufficient documentation

## 2014-11-22 ENCOUNTER — Other Ambulatory Visit: Payer: Self-pay | Admitting: Nurse Practitioner

## 2014-11-22 MED ORDER — LEVOTHYROXINE SODIUM 125 MCG PO TABS: 125.0000 ug | ORAL_TABLET | Freq: Every day | ORAL | Status: DC

## 2014-12-21 ENCOUNTER — Ambulatory Visit (INDEPENDENT_AMBULATORY_CARE_PROVIDER_SITE_OTHER): Payer: 59 | Admitting: Family Medicine

## 2014-12-21 ENCOUNTER — Encounter: Payer: Self-pay | Admitting: Family Medicine

## 2014-12-21 VITALS — Temp 99.0°F | Ht 61.0 in | Wt 183.0 lb

## 2014-12-21 DIAGNOSIS — R1030 Lower abdominal pain, unspecified: Secondary | ICD-10-CM | POA: Diagnosis not present

## 2014-12-21 DIAGNOSIS — R1032 Left lower quadrant pain: Secondary | ICD-10-CM

## 2014-12-21 NOTE — Progress Notes (Signed)
   Subjective:    Patient ID: Sandra Zavala, female    DOB: June 12, 1955, 59 y.o.   MRN: [id number]  HPIgroin pain on left side. noticied it 4 days ago. Treatments tried rest and no lifting.   Denies vomiting diarrhea fever chills denies abdominal discomfort relates severe groin pain with certain movements and when she touches it.  Review of Systems     Objective:   Physical Exam Lungs clear heart regular abdomen soft left groin area very tender when she stands extremely tender worse with cough slight bulge noted       Assessment & Plan:  It is hard to tell if she truly has a hernia or not I recommend referral to Dr. Aviva Signs for further evaluation it is possible this could be a ligament strain related to lifting avoid any lifting or pushing until seen by general surgeon

## 2014-12-26 ENCOUNTER — Telehealth: Payer: Self-pay | Admitting: Family Medicine

## 2014-12-26 NOTE — Telephone Encounter (Signed)
So noted 

## 2014-12-26 NOTE — Telephone Encounter (Signed)
Claiborne Billings with Dr. Arnoldo Morale office called to say that the patient cancelled her appointment to see him for tomorrow morning for a possible hernia, (this was an urgent referral that both Claiborne Billings and I diligently worked on to get patient in as soon as Dr. Arnoldo Morale double booked schedule would allow), patient states she thinks it's just a pulled muscle

## 2015-02-13 ENCOUNTER — Other Ambulatory Visit: Payer: Self-pay | Admitting: *Deleted

## 2015-02-13 MED ORDER — ALENDRONATE SODIUM 70 MG PO TABS: ORAL_TABLET | ORAL | Status: DC

## 2015-02-13 MED ORDER — LEVOTHYROXINE SODIUM 125 MCG PO TABS: 125.0000 ug | ORAL_TABLET | Freq: Every day | ORAL | Status: DC

## 2015-02-16 ENCOUNTER — Other Ambulatory Visit: Payer: Self-pay | Admitting: *Deleted

## 2015-02-16 MED ORDER — LEVOTHYROXINE SODIUM 125 MCG PO TABS: 125.0000 ug | ORAL_TABLET | Freq: Every day | ORAL | Status: DC

## 2015-02-16 MED ORDER — ALENDRONATE SODIUM 70 MG PO TABS: ORAL_TABLET | ORAL | Status: DC

## 2015-02-20 ENCOUNTER — Telehealth: Payer: 59 | Admitting: Family

## 2015-02-20 DIAGNOSIS — J019 Acute sinusitis, unspecified: Secondary | ICD-10-CM | POA: Diagnosis not present

## 2015-02-20 MED ORDER — AMOXICILLIN-POT CLAVULANATE 875-125 MG PO TABS: 1.0000 | ORAL_TABLET | Freq: Two times a day (BID) | ORAL | Status: DC

## 2015-02-20 NOTE — Progress Notes (Signed)

## 2015-04-06 ENCOUNTER — Other Ambulatory Visit: Payer: Self-pay | Admitting: Family Medicine

## 2015-05-06 ENCOUNTER — Other Ambulatory Visit: Payer: Self-pay | Admitting: Family Medicine

## 2015-06-12 ENCOUNTER — Encounter: Payer: Self-pay | Admitting: Nurse Practitioner

## 2015-06-12 ENCOUNTER — Ambulatory Visit (INDEPENDENT_AMBULATORY_CARE_PROVIDER_SITE_OTHER): Payer: 59 | Admitting: Nurse Practitioner

## 2015-06-12 VITALS — BP 120/72 | Ht 59.5 in | Wt 188.0 lb

## 2015-06-12 DIAGNOSIS — Z1151 Encounter for screening for human papillomavirus (HPV): Secondary | ICD-10-CM

## 2015-06-12 DIAGNOSIS — Z13220 Encounter for screening for lipoid disorders: Secondary | ICD-10-CM

## 2015-06-12 DIAGNOSIS — Z124 Encounter for screening for malignant neoplasm of cervix: Secondary | ICD-10-CM

## 2015-06-12 DIAGNOSIS — Z79899 Other long term (current) drug therapy: Secondary | ICD-10-CM | POA: Diagnosis not present

## 2015-06-12 DIAGNOSIS — M810 Age-related osteoporosis without current pathological fracture: Secondary | ICD-10-CM | POA: Diagnosis not present

## 2015-06-12 DIAGNOSIS — Z0000 Encounter for general adult medical examination without abnormal findings: Secondary | ICD-10-CM

## 2015-06-12 MED ORDER — NALTREXONE-BUPROPION HCL ER 8-90 MG PO TB12: ORAL_TABLET | ORAL | Status: DC

## 2015-06-12 MED ORDER — HYDROCORTISONE VALERATE 0.2 % EX CREA: 1.0000 "application " | TOPICAL_CREAM | Freq: Two times a day (BID) | CUTANEOUS | Status: DC

## 2015-06-12 MED ORDER — NALTREXONE-BUPROPION HCL ER 8-90 MG PO TB12
ORAL_TABLET | ORAL | Status: DC
Start: 2015-06-12 — End: 2016-05-25

## 2015-06-14 ENCOUNTER — Encounter: Payer: Self-pay | Admitting: Nurse Practitioner

## 2015-06-14 NOTE — Progress Notes (Addendum)
Subjective:    Patient ID: Sandra Zavala, female    DOB: [birth year], 60 y.o.   MRN: [id number]  HPI presents for her wellness exam. Staying active. Doing well. Diet but still struggling with losing weight. Married, same sexual partner. No vaginal bleeding. No pelvic pain. Regular vision exams. Regular dental exams. Taking vitamin D and calcium supplementation. Has chronic itching in the labial area, will occasionally apply low-dose hydrocortisone cream on a rare basis which relieves her symptoms.    Review of Systems  Constitutional: Negative for activity change, appetite change and fatigue.  HENT: Negative for dental problem, ear pain, sinus pressure and sore throat.   Respiratory: Negative for cough, chest tightness, shortness of breath and wheezing.   Cardiovascular: Negative for chest pain.  Gastrointestinal: Negative for nausea, vomiting, abdominal pain, diarrhea, constipation and abdominal distention.  Genitourinary: Negative for dysuria, urgency, frequency, vaginal bleeding, vaginal discharge, enuresis, difficulty urinating, genital sores and pelvic pain.       Objective:   Physical Exam  Constitutional: She is oriented to person, place, and time. She appears well-developed. No distress.  HENT:  Right Ear: External ear normal.  Left Ear: External ear normal.  Mouth/Throat: Oropharynx is clear and moist.  Neck: Normal range of motion. Neck supple. No tracheal deviation present. No thyromegaly present.  Cardiovascular: Normal rate, regular rhythm and normal heart sounds.  Exam reveals no gallop.   No murmur heard. Pulmonary/Chest: Effort normal and breath sounds normal.  Abdominal: Soft. She exhibits no distension. There is no tenderness.  Genitourinary: Vagina normal and uterus normal. No vaginal discharge found.  External GU: No rashes or lesions. Vagina slightly pale, no discharge. No CMT. Bimanual exam no tenderness or obvious masses, exam limited due to abdominal girth.  Rectal exam no masses, no stool for Hemoccult.  Musculoskeletal: She exhibits no edema.  Lymphadenopathy:    She has no cervical adenopathy.  Neurological: She is alert and oriented to person, place, and time.  Skin: Skin is warm and dry. No rash noted.  Psychiatric: She has a normal mood and affect. Her behavior is normal.  Vitals reviewed.  Breast exam: No masses noted, axilla no adenopathy.        Assessment & Plan:   Problem List Items Addressed This Visit      Musculoskeletal and Integument   Osteoporosis   Relevant Orders   VITAMIN D 25 Hydroxy (Vit-D Deficiency, Fractures)    Other Visit Diagnoses    Routine general medical examination at a health care facility    -  Primary    Relevant Orders    Pap IG and HPV (high risk) DNA detection    Lipid panel    POC Hemoccult Bld/Stl (3-Cd Home Screen)    Screening for cervical cancer        Relevant Orders    Pap IG and HPV (high risk) DNA detection    Screening for HPV (human papillomavirus)        Relevant Orders    Pap IG and HPV (high risk) DNA detection    Screening for lipid disorders        Relevant Orders    Lipid panel    High risk medication use        Relevant Orders    Hepatic function panel    Basic metabolic panel      Continued activity and weight loss efforts. Meds ordered this encounter  Medications  . omeprazole (PRILOSEC) 20 MG capsule  Sig: Take 20 mg by mouth daily.  [name] triamcinolone (NASACORT) 55 MCG/ACT AERO nasal inhaler    Sig: Place 2 sprays into the nose daily.  . Probiotic Product (PROBIOTIC PO)    Sig: Take by mouth.  . DISCONTD: hydrocortisone valerate cream (WESTCORT) 0.2 %    Sig: Apply 1 application topically 2 (two) times daily.  . hydrocortisone valerate cream (WESTCORT) 0.2 %    Sig: Apply 1 application topically 2 (two) times daily. Prn    Dispense:  45 g    Refill:  0    Order Specific Question:  Supervising Provider    Answer:  Mikey Kirschner [[id number]]  .  Naltrexone-Bupropion HCl ER (CONTRAVE) 8-90 MG TB12    Sig: 2 po BID    Dispense:  120 tablet    Refill:  1    Order Specific Question:  Supervising Provider    Answer:  Mikey Kirschner [[id number]]  . Naltrexone-Bupropion HCl ER 8-90 MG TB12    Sig: One po qam x 1 week then one po BID x 1 week then 2 po qam and one po qpm x 1 week then 2 po BID    Dispense:  90 tablet    Refill:  0    Order Specific Question:  Supervising Provider    Answer:  Mikey Kirschner [[id number]]   Use hydrocortisone sparingly. Trial of contra tree as directed. DC med and call if any adverse effects.  Return in about 1 year (around 06/11/2016) for physical. Recheck in 3 months if she wishes to continue weight loss medication.

## 2015-06-16 ENCOUNTER — Other Ambulatory Visit: Payer: Self-pay | Admitting: Nurse Practitioner

## 2015-06-16 DIAGNOSIS — Z79899 Other long term (current) drug therapy: Secondary | ICD-10-CM | POA: Diagnosis not present

## 2015-06-16 DIAGNOSIS — Z0000 Encounter for general adult medical examination without abnormal findings: Secondary | ICD-10-CM | POA: Diagnosis not present

## 2015-06-16 DIAGNOSIS — M810 Age-related osteoporosis without current pathological fracture: Secondary | ICD-10-CM | POA: Diagnosis not present

## 2015-06-16 DIAGNOSIS — Z13220 Encounter for screening for lipoid disorders: Secondary | ICD-10-CM | POA: Diagnosis not present

## 2015-06-16 LAB — HEPATIC FUNCTION PANEL
ALBUMIN: 4 g/dL (ref 3.6–5.1)
ALT: 44 U/L — ABNORMAL HIGH (ref 6–29)
AST: 27 U/L (ref 10–35)
Alkaline Phosphatase: 98 U/L (ref 33–130)
BILIRUBIN DIRECT: 0.1 mg/dL (ref <=0.2)
BILIRUBIN TOTAL: 0.4 mg/dL (ref 0.2–1.2)
Indirect Bilirubin: 0.3 mg/dL (ref 0.2–1.2)
Total Protein: 6.1 g/dL (ref 6.1–8.1)

## 2015-06-16 LAB — LIPID PANEL
CHOLESTEROL: 169 mg/dL (ref 125–200)
HDL: 52 mg/dL (ref >=46)
LDL Cholesterol: 104 mg/dL (ref <130)
TRIGLYCERIDES: 65 mg/dL (ref <150)
Total CHOL/HDL Ratio: 3.3 Ratio (ref <=5.0)
VLDL: 13 mg/dL (ref <30)

## 2015-06-16 LAB — BASIC METABOLIC PANEL
BUN: 10 mg/dL (ref 7–25)
CALCIUM: 8.9 mg/dL (ref 8.6–10.4)
CHLORIDE: 107 mmol/L (ref 98–110)
CO2: 23 mmol/L (ref 20–31)
CREATININE: 0.55 mg/dL (ref 0.50–1.05)
Glucose, Bld: 95 mg/dL (ref 65–99)
Potassium: 4.3 mmol/L (ref 3.5–5.3)
Sodium: 142 mmol/L (ref 135–146)

## 2015-06-17 LAB — VITAMIN D 25 HYDROXY (VIT D DEFICIENCY, FRACTURES): VIT D 25 HYDROXY: 42 ng/mL (ref 30–100)

## 2015-06-18 LAB — PAP IG AND HPV HIGH-RISK
HPV, high-risk: NEGATIVE
PAP SMEAR COMMENT: 0

## 2015-06-22 ENCOUNTER — Other Ambulatory Visit: Payer: Self-pay | Admitting: Family Medicine

## 2015-06-27 ENCOUNTER — Other Ambulatory Visit: Payer: Self-pay

## 2015-06-27 DIAGNOSIS — Z0000 Encounter for general adult medical examination without abnormal findings: Secondary | ICD-10-CM

## 2015-06-27 LAB — POC HEMOCCULT BLD/STL (HOME/3-CARD/SCREEN)
Card #2 Fecal Occult Blod, POC: NEGATIVE
FECAL OCCULT BLD: NEGATIVE
FECAL OCCULT BLD: NEGATIVE

## 2015-06-30 ENCOUNTER — Encounter: Payer: Self-pay | Admitting: Nurse Practitioner

## 2015-07-02 ENCOUNTER — Other Ambulatory Visit: Payer: Self-pay | Admitting: Nurse Practitioner

## 2015-07-02 MED ORDER — ALENDRONATE SODIUM 70 MG PO TABS: ORAL_TABLET | ORAL | Status: DC

## 2015-07-05 DIAGNOSIS — D225 Melanocytic nevi of trunk: Secondary | ICD-10-CM | POA: Diagnosis not present

## 2015-07-05 DIAGNOSIS — D210 Benign neoplasm of connective and other soft tissue of head, face and neck: Secondary | ICD-10-CM | POA: Diagnosis not present

## 2015-07-14 DIAGNOSIS — H5213 Myopia, bilateral: Secondary | ICD-10-CM | POA: Diagnosis not present

## 2015-07-14 DIAGNOSIS — H524 Presbyopia: Secondary | ICD-10-CM | POA: Diagnosis not present

## 2015-07-14 DIAGNOSIS — H25013 Cortical age-related cataract, bilateral: Secondary | ICD-10-CM | POA: Diagnosis not present

## 2015-07-14 DIAGNOSIS — H52223 Regular astigmatism, bilateral: Secondary | ICD-10-CM | POA: Diagnosis not present

## 2015-08-14 ENCOUNTER — Other Ambulatory Visit: Payer: Self-pay | Admitting: Nurse Practitioner

## 2015-08-14 ENCOUNTER — Encounter: Payer: Self-pay | Admitting: Nurse Practitioner

## 2015-08-14 DIAGNOSIS — E039 Hypothyroidism, unspecified: Secondary | ICD-10-CM

## 2015-08-14 MED ORDER — LEVOTHYROXINE SODIUM 125 MCG PO TABS: ORAL_TABLET | ORAL | Status: DC

## 2015-08-17 DIAGNOSIS — E039 Hypothyroidism, unspecified: Secondary | ICD-10-CM | POA: Diagnosis not present

## 2015-08-18 LAB — TSH: TSH: 1.07 u[IU]/mL (ref 0.450–4.500)

## 2015-11-06 ENCOUNTER — Other Ambulatory Visit: Payer: Self-pay | Admitting: Nurse Practitioner

## 2015-11-06 DIAGNOSIS — Z1231 Encounter for screening mammogram for malignant neoplasm of breast: Secondary | ICD-10-CM

## 2015-11-22 ENCOUNTER — Ambulatory Visit (HOSPITAL_COMMUNITY)
Admission: RE | Admit: 2015-11-22 | Discharge: 2015-11-22 | Disposition: A | Discharge status: Home / Self Care | Payer: 59 | Source: Ambulatory Visit | Attending: Nurse Practitioner | Admitting: Nurse Practitioner

## 2015-11-22 DIAGNOSIS — Z1231 Encounter for screening mammogram for malignant neoplasm of breast: Secondary | ICD-10-CM | POA: Insufficient documentation

## 2016-05-25 ENCOUNTER — Ambulatory Visit (INDEPENDENT_AMBULATORY_CARE_PROVIDER_SITE_OTHER): Payer: BLUE CROSS/BLUE SHIELD | Admitting: Family Medicine

## 2016-05-25 VITALS — BP 122/72 | HR 81 | Temp 98.8°F | Resp 17 | Ht 61.0 in | Wt 194.0 lb

## 2016-05-25 DIAGNOSIS — J019 Acute sinusitis, unspecified: Secondary | ICD-10-CM | POA: Diagnosis not present

## 2016-05-25 MED ORDER — AMOXICILLIN 500 MG PO CAPS: 1000.0000 mg | ORAL_CAPSULE | Freq: Two times a day (BID) | ORAL | 0 refills | Status: DC

## 2016-05-25 NOTE — Progress Notes (Signed)
Subjective:  By signing my name below, I, Moises Blood, attest that this documentation has been prepared under the direction and in the presence of Delman Cheadle, MD. Electronically Signed: Moises Blood, [place]. 05/25/2016 , 12:34 PM .  Patient was seen in Room 8 .   Patient ID: Sandra Zavala, female    DOB: [birth year], 61 y.o.   MRN: [id number] Chief Complaint  Patient presents with  . Sinusitis   HPI Sandra Zavala is a 61 y.o. female who presents to Primary Care at [facility] complaining of sinus pressure with a head cold ongoing since the 2nd week of Dec 2017. She's been trying to fight this illness off by herself with OTC medication (mucinex, motrin and nasacort) without relief. She's been having nasal drainage occasionally, which has been clear. Last night, the right side of her face felt sore with pain when opening her mouth as well as pain when chewing. She also had pain when laying down on the right side. She mentions nasal drainage becoming yellow-green yesterday, but clear again today. She feels better at night. She denies fever, chills, shortness of breath, wheezing or palpitations. She has history of sinusitis. She denies any allergies to antibiotics.   No past medical history on file. Prior to Admission medications   Medication Sig Start Date End Date Taking? Authorizing Provider  alendronate (FOSAMAX) 70 MG tablet Take 1 tablet by mouth  every week 07/02/15  Yes Nilda Simmer, NP  calcium carbonate (OS-CAL) 600 MG TABS Take 600 mg by mouth 3 (three) times daily with meals.   Yes Historical Provider, MD  Cholecalciferol (VITAMIN D3) 2000 UNITS TABS Take by mouth 2 (two) times daily.   Yes Historical Provider, MD  fish oil-omega-3 fatty acids 1000 MG capsule Take 2 g by mouth daily.   Yes Historical Provider, MD  hydrocortisone valerate cream (WESTCORT) 0.2 % Apply 1 application topically 2 (two) times daily. Prn 06/12/15  Yes Nilda Simmer, NP  Ibuprofen (MOTRIN PO) Take by  mouth.   Yes Historical Provider, MD  levothyroxine (SYNTHROID, LEVOTHROID) 125 MCG tablet Take 1 tablet by mouth  daily 08/14/15  Yes Nilda Simmer, NP  omeprazole (PRILOSEC) 20 MG capsule Take 20 mg by mouth daily.   Yes Historical Provider, MD  Probiotic Product (PROBIOTIC PO) Take by mouth.   Yes Historical Provider, MD  triamcinolone (NASACORT) 55 MCG/ACT AERO nasal inhaler Place 2 sprays into the nose daily.   Yes Historical Provider, MD  vitamin C (ASCORBIC ACID) 500 MG tablet Take 500 mg by mouth daily.   Yes Historical Provider, MD   No Known Allergies   Review of Systems  Constitutional: Negative for chills, fatigue, fever and unexpected weight change.  HENT: Positive for ear pain, rhinorrhea and sinus pressure.   Respiratory: Negative for cough, shortness of breath and wheezing.   Cardiovascular: Negative for palpitations.  Gastrointestinal: Negative for constipation, diarrhea, nausea and vomiting.  Skin: Negative for rash and wound.  Neurological: Positive for headaches. Negative for dizziness and weakness.       Objective:   Physical Exam  Constitutional: She is oriented to person, place, and time. She appears well-developed and well-nourished. No distress.  HENT:  Head: Normocephalic and atraumatic.  Right Ear: Tympanic membrane normal.  Left Ear: Tympanic membrane is scarred. A middle ear effusion is present.  Nose: Nose normal.  Mouth/Throat: Oropharynx is clear and moist.  Some scarring over left TM  Eyes: EOM are normal. Pupils are equal,  round, and reactive to light.  Neck: Neck supple. No thyromegaly present.  Cardiovascular: Normal rate, regular rhythm and normal heart sounds.   No murmur heard. Pulmonary/Chest: Effort normal and breath sounds normal. No respiratory distress.  Musculoskeletal: Normal range of motion.  Lymphadenopathy:    She has no cervical adenopathy.  Neurological: She is alert and oriented to person, place, and time.  Skin: Skin is  warm and dry.  Psychiatric: She has a normal mood and affect. Her behavior is normal.  Nursing note and vitals reviewed.   BP 122/72 (BP Location: Right Arm, Patient Position: Sitting, Cuff Size: Normal)   Pulse 81   Temp 98.8 F (37.1 C) (Oral)   Resp 17   Ht 5\' 1"  (1.549 m)   Wt 194 lb (88 kg)   SpO2 95%   BMI 36.66 kg/m     Assessment & Plan:   1. Acute sinusitis, recurrence not specified, unspecified location   If sxs cont, try netti pot or sinus rinse, if pressure persists may need to consider systemic steroids.  Meds ordered this encounter  Medications  . amoxicillin (AMOXIL) 500 MG capsule    Sig: Take 2 capsules (1,000 mg total) by mouth 2 (two) times daily.    Dispense:  28 capsule    Refill:  0    I personally performed the services described in this documentation, which was scribed in my presence. The recorded information has been reviewed and considered, and addended by me as needed.   Delman Cheadle, M.D.  [facility] [address] [place], [place] [address] [address] [phone] phone [phone] fax  05/25/16 1:34 PM

## 2016-05-25 NOTE — Patient Instructions (Addendum)
   IF you received an x-ray today, you will receive an invoice from [facility]. Please contact [facility] at [phone] with questions or concerns regarding your invoice.   IF you received labwork today, you will receive an invoice from [facility]. Please contact [facility] at [phone] with questions or concerns regarding your invoice.   Our billing staff will not be able to assist you with questions regarding bills from these companies.  You will be contacted with the lab results as soon as they are available. The fastest way to get your results is to activate your My Chart account. Instructions are located on the last page of this paperwork. If you have not heard from us regarding the results in 2 weeks, please contact this office.      Sinusitis, Adult Sinusitis is soreness and inflammation of your sinuses. Sinuses are hollow spaces in the bones around your face. Your sinuses are located:  Around your eyes.  In the middle of your forehead.  Behind your nose.  In your cheekbones. Your sinuses and nasal passages are lined with a stringy fluid (mucus). Mucus normally drains out of your sinuses. When your nasal tissues become inflamed or swollen, the mucus can become trapped or blocked so air cannot flow through your sinuses. This allows bacteria, viruses, and funguses to grow, which leads to infection. Sinusitis can develop quickly and last for 7?10 days (acute) or for more than 12 weeks (chronic). Sinusitis often develops after a cold. What are the causes? This condition is caused by anything that creates swelling in the sinuses or stops mucus from draining, including:  Allergies.  Asthma.  Bacterial or viral infection.  Abnormally shaped bones between the nasal passages.  Nasal growths that contain mucus (nasal polyps).  Narrow sinus openings.  Pollutants, such as chemicals or irritants in the air.  A foreign object stuck in the nose.  A fungal  infection. This is rare. What increases the risk? The following factors may make you more likely to develop this condition:  Having allergies or asthma.  Having had a recent cold or respiratory tract infection.  Having structural deformities or blockages in your nose or sinuses.  Having a weak immune system.  Doing a lot of swimming or diving.  Overusing nasal sprays.  Smoking. What are the signs or symptoms? The main symptoms of this condition are pain and a feeling of pressure around the affected sinuses. Other symptoms include:  Upper toothache.  Earache.  Headache.  Bad breath.  Decreased sense of smell and taste.  A cough that may get worse at night.  Fatigue.  Fever.  Thick drainage from your nose. The drainage is often green and it may contain pus (purulent).  Stuffy nose or congestion.  Postnasal drip. This is when extra mucus collects in the throat or back of the nose.  Swelling and warmth over the affected sinuses.  Sore throat.  Sensitivity to light. How is this diagnosed? This condition is diagnosed based on symptoms, a medical history, and a physical exam. To find out if your condition is acute or chronic, your health care provider may:  Look in your nose for signs of nasal polyps.  Tap over the affected sinus to check for signs of infection.  View the inside of your sinuses using an imaging device that has a light attached (endoscope). If your health care provider suspects that you have chronic sinusitis, you may also:  Be tested for allergies.  Have a sample of   mucus taken from your nose (nasal culture) and checked for bacteria.  Have a mucus sample examined to see if your sinusitis is related to an allergy. If your sinusitis does not respond to treatment and it lasts longer than 8 weeks, you may have an MRI or CT scan to check your sinuses. These scans also help to determine how severe your infection is. In rare cases, a bone biopsy may  be done to rule out more serious types of fungal sinus disease. How is this treated? Treatment for sinusitis depends on the cause and whether your condition is chronic or acute. If a virus is causing your sinusitis, your symptoms will go away on their own within 10 days. You may be given medicines to relieve your symptoms, including:  Topical nasal decongestants. They shrink swollen nasal passages and let mucus drain from your sinuses.  Antihistamines. These drugs block inflammation that is triggered by allergies. This can help to ease swelling in your nose and sinuses.  Topical nasal corticosteroids. These are nasal sprays that ease inflammation and swelling in your nose and sinuses.  Nasal saline washes. These rinses can help to get rid of thick mucus in your nose. If your condition is caused by bacteria, you will be given an antibiotic medicine. If your condition is caused by a fungus, you will be given an antifungal medicine. Surgery may be needed to correct underlying conditions, such as narrow nasal passages. Surgery may also be needed to remove polyps. Follow these instructions at home: Medicines   Take, use, or apply over-the-counter and prescription medicines only as told by your health care provider. These may include nasal sprays.  If you were prescribed an antibiotic medicine, take it as told by your health care provider. Do not stop taking the antibiotic even if you start to feel better. Hydrate and Humidify   Drink enough water to keep your urine clear or pale yellow. Staying hydrated will help to thin your mucus.  Use a cool mist humidifier to keep the humidity level in your home above 50%.  Inhale steam for 10-15 minutes, 3-4 times a day or as told by your health care provider. You can do this in the bathroom while a hot shower is running.  Limit your exposure to cool or dry air. Rest   Rest as much as possible.  Sleep with your head raised (elevated).  Make sure to  get enough sleep each night. General instructions   Apply a warm, moist washcloth to your face 3-4 times a day or as told by your health care provider. This will help with discomfort.  Wash your hands often with soap and water to reduce your exposure to viruses and other germs. If soap and water are not available, use hand sanitizer.  Do not smoke. Avoid being around people who are smoking (secondhand smoke).  Keep all follow-up visits as told by your health care provider. This is important. Contact a health care provider if:  You have a fever.  Your symptoms get worse.  Your symptoms do not improve within 10 days. Get help right away if:  You have a severe headache.  You have persistent vomiting.  You have pain or swelling around your face or eyes.  You have vision problems.  You develop confusion.  Your neck is stiff.  You have trouble breathing. This information is not intended to replace advice given to you by your health care provider. Make sure you discuss any questions you have with   your health care provider. Document Released: 04/22/2005 Document Revised: 12/17/2015 Document Reviewed: 02/15/2015 Elsevier Interactive Patient Education  2017 [employer] Inc[employer]  

## 2016-06-12 ENCOUNTER — Encounter: Payer: Self-pay | Admitting: Nurse Practitioner

## 2016-06-12 ENCOUNTER — Ambulatory Visit (INDEPENDENT_AMBULATORY_CARE_PROVIDER_SITE_OTHER): Payer: BLUE CROSS/BLUE SHIELD | Admitting: Nurse Practitioner

## 2016-06-12 VITALS — BP 124/88 | Ht 60.0 in | Wt 194.0 lb

## 2016-06-12 DIAGNOSIS — M810 Age-related osteoporosis without current pathological fracture: Secondary | ICD-10-CM

## 2016-06-12 DIAGNOSIS — Z780 Asymptomatic menopausal state: Secondary | ICD-10-CM

## 2016-06-12 DIAGNOSIS — Z13220 Encounter for screening for lipoid disorders: Secondary | ICD-10-CM

## 2016-06-12 DIAGNOSIS — Z79899 Other long term (current) drug therapy: Secondary | ICD-10-CM

## 2016-06-12 DIAGNOSIS — E039 Hypothyroidism, unspecified: Secondary | ICD-10-CM | POA: Diagnosis not present

## 2016-06-12 DIAGNOSIS — Z0000 Encounter for general adult medical examination without abnormal findings: Secondary | ICD-10-CM

## 2016-06-12 MED ORDER — HYDROCORTISONE VALERATE 0.2 % EX CREA: 1.0000 "application " | TOPICAL_CREAM | Freq: Two times a day (BID) | CUTANEOUS | 0 refills | Status: DC

## 2016-06-12 MED ORDER — ALENDRONATE SODIUM 70 MG PO TABS: ORAL_TABLET | ORAL | 3 refills | Status: DC

## 2016-06-12 NOTE — Progress Notes (Signed)
Subjective:    Patient ID: Sandra Zavala, female    DOB: July 28, 1955, 61 y.o.   MRN: [id number]  HPI  61 yo female, seen today for routine wellness exam.  Pt. Up to date with routine eye/dental exams with no new changes.  Last mamogram 7/17, pt schedules and completes on her own.  Due for bone density test.  Last colonoscopy done 2009, next due 2019.  Last PAP done 01/2104, not due at this time. Pt. Post-menopausal, denies any changes to sexual partners, and no vaginal bleeding or discharge.      Review of Systems  Constitutional: Negative for activity change, appetite change, chills, fatigue, fever and unexpected weight change.  HENT: Negative for congestion, dental problem, ear pain, facial swelling, sinus pain, sinus pressure and sore throat.        Recent sinus infection, treated with Amoxicillin with good tolerance and resolved symptoms  Respiratory: Negative for cough, chest tightness, shortness of breath and wheezing.   Cardiovascular: Negative for chest pain, palpitations and leg swelling.  Gastrointestinal: Negative for abdominal distention, abdominal pain, constipation, diarrhea, nausea and vomiting.  Genitourinary: Negative for difficulty urinating, dysuria, enuresis, frequency, genital sores, pelvic pain, urgency, vaginal bleeding, vaginal discharge and vaginal pain.  Neurological: Negative for dizziness and weakness.       Objective:   Physical Exam  Constitutional: She is oriented to person, place, and time. She appears well-developed. No distress.  HENT:  Right Ear: External ear normal.  Left Ear: External ear normal.  Mouth/Throat: Oropharynx is clear and moist.  Tm: mild clear effusion    Neck: Normal range of motion. Neck supple. No tracheal deviation present. No thyromegaly present.  Cardiovascular: Normal rate, regular rhythm and normal heart sounds.  Exam reveals no gallop.   No murmur heard. Pulmonary/Chest: Effort normal and breath sounds normal.  Abdominal:  Soft. She exhibits no distension. There is no tenderness.  Genitourinary: Vagina normal and uterus normal. No vaginal discharge found.  Genitourinary Comments: External: no rashes or lesions noted  Bimanual exam: no CMT, no obvious palpable masses    Musculoskeletal: She exhibits no edema.  Lymphadenopathy:    She has no cervical adenopathy.  Neurological: She is alert and oriented to person, place, and time.  Skin: Skin is warm and dry. No rash noted.  Psychiatric: She has a normal mood and affect. Her behavior is normal.  Breast: symmetrical, no discharge or skin color changes , no axillary lymphnodes palpable ,no obvious masses palpable          Assessment & Plan:   Problem List Items Addressed This Visit      Musculoskeletal and Integument   Osteoporosis   Relevant Medications   alendronate (FOSAMAX) 70 MG tablet   Other Relevant Orders   DG Bone Density   VITAMIN D 25 Hydroxy (Vit-D Deficiency, Fractures)    Other Visit Diagnoses    Routine general medical examination at a health care facility    -  Primary   Relevant Orders   TSH   Hepatic function panel   Basic metabolic panel   Lipid panel   VITAMIN D 25 Hydroxy (Vit-D Deficiency, Fractures)   Post-menopausal       Relevant Orders   DG Bone Density   VITAMIN D 25 Hydroxy (Vit-D Deficiency, Fractures)   Hypothyroidism, unspecified type       Relevant Orders   TSH   Screening for lipid disorders       Relevant Orders  Lipid panel   High risk medication use       Relevant Orders   Hepatic function panel   Basic metabolic panel     Plan:  Meds ordered this encounter  Medications  . alendronate (FOSAMAX) 70 MG tablet    Sig: Take 1 tablet by mouth  every week    Dispense:  12 tablet    Refill:  3    Order Specific Question:   Supervising Provider    Answer:   Mikey Kirschner [[id number]]  . hydrocortisone valerate cream (WESTCORT) 0.2 %    Sig: Apply 1 application topically 2 (two) times daily. Prn     Dispense:  45 g    Refill:  0    Order Specific Question:   Supervising Provider    Answer:   Mikey Kirschner [[id number]]   Plan:   Pt. Will call to schedule mammogram  Lab work pending  Continue to take Calcium and Vit. D supplements Call office with any worsening symptoms of previous sinus infection.  Call office for any other questions or concerns.    Return in about 1 year (around 06/12/2017) for physical.

## 2016-06-13 DIAGNOSIS — Z13220 Encounter for screening for lipoid disorders: Secondary | ICD-10-CM | POA: Diagnosis not present

## 2016-06-13 DIAGNOSIS — M810 Age-related osteoporosis without current pathological fracture: Secondary | ICD-10-CM | POA: Diagnosis not present

## 2016-06-13 DIAGNOSIS — E039 Hypothyroidism, unspecified: Secondary | ICD-10-CM | POA: Diagnosis not present

## 2016-06-13 DIAGNOSIS — Z79899 Other long term (current) drug therapy: Secondary | ICD-10-CM | POA: Diagnosis not present

## 2016-06-13 DIAGNOSIS — Z0000 Encounter for general adult medical examination without abnormal findings: Secondary | ICD-10-CM | POA: Diagnosis not present

## 2016-06-14 LAB — BASIC METABOLIC PANEL
BUN / CREAT RATIO: 21 (ref 9–23)
BUN: 13 mg/dL (ref 6–24)
CHLORIDE: 102 mmol/L (ref 96–106)
CO2: 24 mmol/L (ref 18–29)
Calcium: 9.7 mg/dL (ref 8.7–10.2)
Creatinine, Ser: 0.62 mg/dL (ref 0.57–1.00)
GFR calc non Af Amer: 99 mL/min/{1.73_m2} (ref >59)
GFR, EST AFRICAN AMERICAN: 114 mL/min/{1.73_m2} (ref >59)
GLUCOSE: 92 mg/dL (ref 65–99)
Potassium: 4.8 mmol/L (ref 3.5–5.2)
SODIUM: 142 mmol/L (ref 134–144)

## 2016-06-14 LAB — LIPID PANEL
CHOLESTEROL TOTAL: 165 mg/dL (ref 100–199)
Chol/HDL Ratio: 3.2 ratio units (ref 0.0–4.4)
HDL: 51 mg/dL (ref >39)
LDL CALC: 99 mg/dL (ref 0–99)
Triglycerides: 75 mg/dL (ref 0–149)
VLDL CHOLESTEROL CAL: 15 mg/dL (ref 5–40)

## 2016-06-14 LAB — TSH: TSH: 2.43 u[IU]/mL (ref 0.450–4.500)

## 2016-06-14 LAB — HEPATIC FUNCTION PANEL
ALK PHOS: 112 IU/L (ref 39–117)
ALT: 45 IU/L — AB (ref 0–32)
AST: 27 IU/L (ref 0–40)
Albumin: 4.7 g/dL (ref 3.5–5.5)
BILIRUBIN TOTAL: 0.5 mg/dL (ref 0.0–1.2)
Bilirubin, Direct: 0.13 mg/dL (ref 0.00–0.40)
Total Protein: 6.2 g/dL (ref 6.0–8.5)

## 2016-06-14 LAB — VITAMIN D 25 HYDROXY (VIT D DEFICIENCY, FRACTURES): VIT D 25 HYDROXY: 50 ng/mL (ref 30.0–100.0)

## 2016-06-20 ENCOUNTER — Ambulatory Visit (HOSPITAL_COMMUNITY)
Admission: RE | Admit: 2016-06-20 | Discharge: 2016-06-20 | Disposition: A | Discharge status: Home / Self Care | Payer: BLUE CROSS/BLUE SHIELD | Source: Ambulatory Visit | Attending: Nurse Practitioner | Admitting: Nurse Practitioner

## 2016-06-20 DIAGNOSIS — M85852 Other specified disorders of bone density and structure, left thigh: Secondary | ICD-10-CM | POA: Insufficient documentation

## 2016-06-20 DIAGNOSIS — Z780 Asymptomatic menopausal state: Secondary | ICD-10-CM | POA: Diagnosis not present

## 2016-06-20 DIAGNOSIS — M810 Age-related osteoporosis without current pathological fracture: Secondary | ICD-10-CM | POA: Insufficient documentation

## 2016-08-19 ENCOUNTER — Encounter: Payer: Self-pay | Admitting: Nurse Practitioner

## 2016-08-19 ENCOUNTER — Other Ambulatory Visit: Payer: Self-pay | Admitting: Nurse Practitioner

## 2016-08-19 MED ORDER — LEVOTHYROXINE SODIUM 125 MCG PO TABS: ORAL_TABLET | ORAL | 3 refills | Status: DC

## 2016-09-06 ENCOUNTER — Encounter: Payer: Self-pay | Admitting: Nurse Practitioner

## 2016-09-06 ENCOUNTER — Other Ambulatory Visit: Payer: Self-pay | Admitting: Nurse Practitioner

## 2016-09-06 DIAGNOSIS — J329 Chronic sinusitis, unspecified: Secondary | ICD-10-CM | POA: Diagnosis not present

## 2016-09-06 DIAGNOSIS — J40 Bronchitis, not specified as acute or chronic: Secondary | ICD-10-CM | POA: Diagnosis not present

## 2016-09-06 DIAGNOSIS — J069 Acute upper respiratory infection, unspecified: Secondary | ICD-10-CM | POA: Diagnosis not present

## 2016-09-06 MED ORDER — AMOXICILLIN-POT CLAVULANATE 875-125 MG PO TABS: 1.0000 | ORAL_TABLET | Freq: Two times a day (BID) | ORAL | 0 refills | Status: DC

## 2016-10-01 ENCOUNTER — Encounter: Payer: Self-pay | Admitting: Nurse Practitioner

## 2016-10-03 ENCOUNTER — Encounter: Payer: Self-pay | Admitting: Nurse Practitioner

## 2016-10-03 ENCOUNTER — Other Ambulatory Visit: Payer: Self-pay | Admitting: Nurse Practitioner

## 2016-10-03 ENCOUNTER — Other Ambulatory Visit: Payer: Self-pay

## 2016-10-03 MED ORDER — PREDNISONE 20 MG PO TABS: ORAL_TABLET | ORAL | 0 refills | Status: DC

## 2016-10-03 MED ORDER — LEVOFLOXACIN 500 MG PO TABS: 500.0000 mg | ORAL_TABLET | Freq: Every day | ORAL | 0 refills | Status: DC

## 2016-10-09 ENCOUNTER — Other Ambulatory Visit: Payer: Self-pay | Admitting: Nurse Practitioner

## 2016-10-09 DIAGNOSIS — Z1231 Encounter for screening mammogram for malignant neoplasm of breast: Secondary | ICD-10-CM

## 2016-11-25 ENCOUNTER — Ambulatory Visit (HOSPITAL_COMMUNITY)
Admission: RE | Admit: 2016-11-25 | Discharge: 2016-11-25 | Disposition: A | Discharge status: Home / Self Care | Payer: BLUE CROSS/BLUE SHIELD | Source: Ambulatory Visit | Attending: Nurse Practitioner | Admitting: Nurse Practitioner

## 2016-11-25 DIAGNOSIS — Z1231 Encounter for screening mammogram for malignant neoplasm of breast: Secondary | ICD-10-CM | POA: Diagnosis not present

## 2017-02-25 ENCOUNTER — Ambulatory Visit (INDEPENDENT_AMBULATORY_CARE_PROVIDER_SITE_OTHER): Payer: BLUE CROSS/BLUE SHIELD | Admitting: *Deleted

## 2017-02-25 DIAGNOSIS — Z23 Encounter for immunization: Secondary | ICD-10-CM

## 2017-04-08 ENCOUNTER — Encounter: Payer: Self-pay | Admitting: Gastroenterology

## 2017-05-30 ENCOUNTER — Encounter: Payer: Self-pay | Admitting: Family Medicine

## 2017-05-30 ENCOUNTER — Ambulatory Visit: Payer: BLUE CROSS/BLUE SHIELD | Admitting: Family Medicine

## 2017-05-30 ENCOUNTER — Telehealth: Payer: Self-pay | Admitting: Family Medicine

## 2017-05-30 VITALS — Temp 99.8°F | Ht 60.0 in | Wt 188.2 lb

## 2017-05-30 DIAGNOSIS — J111 Influenza due to unidentified influenza virus with other respiratory manifestations: Secondary | ICD-10-CM

## 2017-05-30 MED ORDER — ALBUTEROL SULFATE HFA 108 (90 BASE) MCG/ACT IN AERS: 2.0000 | INHALATION_SPRAY | RESPIRATORY_TRACT | 2 refills | Status: DC | PRN

## 2017-05-30 NOTE — Telephone Encounter (Signed)
Prescription sent electronically to pharmacy. Patient notified. 

## 2017-05-30 NOTE — Patient Instructions (Signed)

## 2017-05-30 NOTE — Progress Notes (Signed)
   Subjective:    Patient ID: Sandra Zavala, female    DOB: 1955/05/07, 62 y.o.   MRN: [id number]  Cough  This is a new problem. The current episode started in the past 7 days. Associated symptoms include a fever, headaches, myalgias, nasal congestion, rhinorrhea and wheezing. Pertinent negatives include no chest pain, ear pain or shortness of breath.  Patient with body aches headache congestion fever chills started approximately 2 days ago.  PMH benign  12 family members including husband have the flu  Review of Systems  Constitutional: Positive for fatigue and fever. Negative for activity change.  HENT: Positive for congestion and rhinorrhea. Negative for ear pain.   Eyes: Negative for discharge.  Respiratory: Positive for cough and wheezing. Negative for shortness of breath.   Cardiovascular: Negative for chest pain.  Gastrointestinal: Positive for nausea. Negative for vomiting.  Musculoskeletal: Positive for myalgias.  Neurological: Positive for headaches.       Objective:   Physical Exam  Constitutional: She appears well-developed.  HENT:  Head: Normocephalic.  Right Ear: External ear normal.  Left Ear: External ear normal.  Nose: Nose normal.  Mouth/Throat: Oropharynx is clear and moist. No oropharyngeal exudate.  Eyes: Right eye exhibits no discharge. Left eye exhibits no discharge.  Neck: Neck supple. No tracheal deviation present.  Cardiovascular: Normal rate and normal heart sounds.  No murmur heard. Pulmonary/Chest: Effort normal and breath sounds normal. She has no wheezes. She has no rales.  Lymphadenopathy:    She has no cervical adenopathy.  Skin: Skin is warm and dry.  Nursing note and vitals reviewed.   Lungs did not reveal any issue with pneumonia      Assessment & Plan:  Viral syndrome No need for antibiotics Hold off on Tamiflu after discussion with patient Influenza-the patient was diagnosed with influenza. Patient/family educated about the flu  and warning signs to watch for. If difficulty breathing, severe neck pain and stiffness, cyanosis, disorientation, or progressive worsening then immediately get rechecked at that ER. If progressive symptoms be certain to be rechecked. Supportive measures such as Tylenol/ibuprofen was discussed. No aspirin use in children. And influenza home care instruction sheet was given.

## 2017-05-30 NOTE — Telephone Encounter (Signed)
Pt is needing the albuterol inhaler to be sent to [facility] in [place] instead of the pharmacy she told the provider to send it to.

## 2017-06-03 ENCOUNTER — Other Ambulatory Visit: Payer: Self-pay | Admitting: *Deleted

## 2017-06-03 ENCOUNTER — Encounter: Payer: Self-pay | Admitting: Nurse Practitioner

## 2017-06-03 ENCOUNTER — Ambulatory Visit: Payer: 59 | Admitting: Nurse Practitioner

## 2017-06-03 ENCOUNTER — Encounter: Payer: Self-pay | Admitting: *Deleted

## 2017-06-03 DIAGNOSIS — R197 Diarrhea, unspecified: Secondary | ICD-10-CM

## 2017-06-03 DIAGNOSIS — R1032 Left lower quadrant pain: Secondary | ICD-10-CM

## 2017-06-03 DIAGNOSIS — R109 Unspecified abdominal pain: Secondary | ICD-10-CM | POA: Insufficient documentation

## 2017-06-03 MED ORDER — PEG 3350-KCL-NA BICARB-NACL 420 G PO SOLR: 4000.0000 mL | Freq: Once | ORAL | 0 refills | Status: AC

## 2017-06-03 NOTE — Assessment & Plan Note (Signed)
The patient has had left lower quadrant abdominal pain associated with watery diarrhea for about 1 year.  The pain resolves after bowel movement.  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  She is not a candidate for Viberzi due to status post cholecystectomy.  Less likely infection.  However, I will check for GI pathogen panel prior to initiating diarrhea limiting treatment.  Return for follow-up in 3 months.

## 2017-06-03 NOTE — Assessment & Plan Note (Signed)
Patient has had watery yellow diarrhea for about 1 year.  She tends to have 2-4 bowel movements in the morning, essentially back to back, and then none later in the day.  Mild abdominal pain associated with this which resolved after her diarrhea.  I will check for GI infection.  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  She is currently due for a colonoscopy and we will proceed with scheduling this at this time.  Proceed with colonoscopy with Dr. Oneida Alar in the near future. The risks, benefits, and alternatives have been discussed in detail with the patient. They state understanding and desire to proceed.   The patient is not on any anticoagulants, anxiolytics, chronic pain medications, or antidepressants.  Conscious sedation should be adequate for her procedure.

## 2017-06-03 NOTE — Patient Instructions (Signed)
1. Bring your stool samples to the lab, not our office. 2. We will call you with your results.  If you do not have any infection we will make recommendations for treatment for your diarrhea and abdominal pain. 3. We will schedule your colonoscopy for you. 4. Return for follow-up in 3 months. 5. Call if you have any questions or concerns.

## 2017-06-03 NOTE — Progress Notes (Signed)
Referring Provider: Kathyrn Drown, MD Primary Care Physician:  Kathyrn Drown, MD Primary GI:  Dr. Oneida Alar  Chief Complaint  Patient presents with  . Abdominal Pain  . Diarrhea    yellow, watery  . Nausea  . Colonoscopy    due for 10 yr tcs    HPI:   Sandra Zavala is a 62 y.o. female who presents for GI complaints pending due for 10-year colonoscopy.  Patient has not been seen in our office since 2009.  Colonoscopy completed 05/15/2007 which found rare sigmoid diverticula, small internal hemorrhoids, no polyps, masses, inflammatory changes, or AVM seen.  Recommended repeat colonoscopy in 10 years.  Today she states she's doing ok overall. She had the flu last week as did 13 members of her family. She's feeling better but still has a little bit of a cough. She started having symptoms of watery/yellow stools, thinks she may have IBS. Last summer had possible diverticulitis. No recurrence. Has a frequent, vague, LLQ discomfort. Has kidney stone and ovarian workup which were negative. The pain tends to get batter after a bowel movement. Rare solid stools, has diarrhea 2-4 times in the morning and then none later in the day.Denies ongoing N/V, hematochezia, melena, fever, chills, unintentional weight loss, changes in bowel habits. Denies chest pain, dyspnea, dizziness, lightheadedness, syncope, near syncope. Denies any other upper or lower GI symptoms.  Past Medical History:  Diagnosis Date  . Hypothyroid     Past Surgical History:  Procedure Laterality Date  . CHOLECYSTECTOMY  1999  . TONSILECTOMY, ADENOIDECTOMY, BILATERAL MYRINGOTOMY AND TUBES  1962    Current Outpatient Medications  Medication Sig Dispense Refill  . albuterol (PROVENTIL HFA;VENTOLIN HFA) 108 (90 Base) MCG/ACT inhaler Inhale 2 puffs into the lungs every 4 (four) hours as needed for wheezing. 1 Inhaler 2  . calcium carbonate (OS-CAL) 600 MG TABS Take 600 mg by mouth 3 (three) times daily with meals.    .  Cholecalciferol (VITAMIN D3) 2000 UNITS TABS Take by mouth 2 (two) times daily.    . Ibuprofen (MOTRIN PO) Take by mouth as needed.     [name] levothyroxine (SYNTHROID, LEVOTHROID) 125 MCG tablet Take 1 tablet by mouth  daily 90 tablet 3  . Multiple Vitamins-Minerals (ICAPS PO) Take by mouth daily.    . Probiotic Product (PROBIOTIC PO) Take by mouth.    . triamcinolone (NASACORT) 55 MCG/ACT AERO nasal inhaler Place 2 sprays into the nose daily.    . vitamin C (ASCORBIC ACID) 500 MG tablet Take 500 mg by mouth daily.     No current facility-administered medications for this visit.     Allergies as of 06/03/2017  . (No Known Allergies)    Family History  Problem Relation Age of Onset  . Osteoporosis Mother   . Heart disease Mother 28       stent  . Pancreatic cancer Father        unknown if primary pancreatic vs liver  . Liver cancer Father        unknown if primary pancreatic vs liver  . Colon cancer Neg Hx   . Gastric cancer Neg Hx   . Esophageal cancer Neg Hx     Social History   Socioeconomic History  . Marital status: Married    Spouse name: None  . Number of children: None  . Years of education: None  . Highest education level: None  Social Needs  . Financial resource strain: None  .  Food insecurity - worry: None  . Food insecurity - inability: None  . Transportation needs - medical: None  . Transportation needs - non-medical: None  Occupational History  . None  Tobacco Use  . Smoking status: Never Smoker  . Smokeless tobacco: Never Used  Substance and Sexual Activity  . Alcohol use: No  . Drug use: No  . Sexual activity: Yes  Other Topics Concern  . None  Social History Narrative  . None    Review of Systems: Complete ROS negative except as per HPI.   Physical Exam: BP 131/87   Pulse 79   Temp (!) 97.4 F (36.3 C) (Oral)   Ht 5' (1.524 m)   Wt 186 lb 6.4 oz (84.6 kg)   BMI 36.40 kg/m  General:   Alert and oriented. Pleasant and cooperative.  Well-nourished and well-developed.  Eyes:  Without icterus, sclera clear and conjunctiva pink.  Ears:  Normal auditory acuity. Cardiovascular:  S1, S2 present without murmurs appreciated. Extremities without clubbing or edema. Respiratory:  Clear to auscultation bilaterally. No wheezes, rales, or rhonchi. No distress.  Gastrointestinal:  +BS, rounded but soft, non-tender and non-distended. No HSM noted. No guarding or rebound. No masses appreciated.  Rectal:  Deferred  Musculoskalatal:  Symmetrical without gross deformities. Neurologic:  Alert and oriented x4;  grossly normal neurologically. Psych:  Alert and cooperative. Normal mood and affect. Heme/Lymph/Immune: No excessive bruising noted.    06/03/2017 9:44 AM   Disclaimer: This note was dictated with voice recognition software. Similar sounding words can inadvertently be transcribed and may not be corrected upon review.

## 2017-06-03 NOTE — Addendum Note (Signed)
Addended by: Gordy Levan, Tiras Bianchini A on: 06/03/2017 10:01 AM   Modules accepted: Orders

## 2017-06-03 NOTE — Progress Notes (Signed)
CC'ED TO PCP 

## 2017-06-04 DIAGNOSIS — R1032 Left lower quadrant pain: Secondary | ICD-10-CM | POA: Diagnosis not present

## 2017-06-04 DIAGNOSIS — R197 Diarrhea, unspecified: Secondary | ICD-10-CM | POA: Diagnosis not present

## 2017-06-06 LAB — GASTROINTESTINAL PATHOGEN PANEL PCR
C. difficile Tox A/B, PCR: NOT DETECTED
CRYPTOSPORIDIUM, PCR: NOT DETECTED
Campylobacter, PCR: NOT DETECTED
E COLI (ETEC) LT/ST, PCR: NOT DETECTED
E COLI (STEC) STX1/STX2, PCR: NOT DETECTED
E coli 0157, PCR: NOT DETECTED
Giardia lamblia, PCR: NOT DETECTED
NOROVIRUS, PCR: NOT DETECTED
Rotavirus A, PCR: NOT DETECTED
SALMONELLA, PCR: NOT DETECTED
Shigella, PCR: NOT DETECTED

## 2017-06-09 DIAGNOSIS — L853 Xerosis cutis: Secondary | ICD-10-CM | POA: Diagnosis not present

## 2017-06-09 DIAGNOSIS — L245 Irritant contact dermatitis due to other chemical products: Secondary | ICD-10-CM | POA: Diagnosis not present

## 2017-06-09 DIAGNOSIS — R21 Rash and other nonspecific skin eruption: Secondary | ICD-10-CM | POA: Diagnosis not present

## 2017-06-16 ENCOUNTER — Ambulatory Visit (INDEPENDENT_AMBULATORY_CARE_PROVIDER_SITE_OTHER): Payer: BLUE CROSS/BLUE SHIELD | Admitting: Nurse Practitioner

## 2017-06-16 ENCOUNTER — Encounter: Payer: Self-pay | Admitting: Nurse Practitioner

## 2017-06-16 VITALS — BP 130/86 | Ht 60.0 in | Wt 180.8 lb

## 2017-06-16 DIAGNOSIS — Z79899 Other long term (current) drug therapy: Secondary | ICD-10-CM

## 2017-06-16 DIAGNOSIS — E039 Hypothyroidism, unspecified: Secondary | ICD-10-CM | POA: Diagnosis not present

## 2017-06-16 DIAGNOSIS — Z13220 Encounter for screening for lipoid disorders: Secondary | ICD-10-CM

## 2017-06-16 DIAGNOSIS — Z01419 Encounter for gynecological examination (general) (routine) without abnormal findings: Secondary | ICD-10-CM

## 2017-06-17 ENCOUNTER — Encounter: Payer: Self-pay | Admitting: Nurse Practitioner

## 2017-06-17 NOTE — Progress Notes (Signed)
   Subjective:    Patient ID: Sandra Zavala, female    DOB: 1955/11/24, 62 y.o.   MRN: [id number]  HPI presents for her wellness exam. No vaginal bleeding or pelvic pain. Same sexual partner. Has lost 14 lbs. Walking 3 miles 5 x per week. Regular vision and dental exams. Has her colonoscopy scheduled this month.     Review of Systems  Constitutional: Negative for activity change, appetite change and fatigue.  HENT: Negative for dental problem, ear pain, sinus pressure and sore throat.   Respiratory: Negative for cough, chest tightness, shortness of breath and wheezing.   Cardiovascular: Negative for chest pain.  Gastrointestinal: Negative for abdominal distention, abdominal pain, blood in stool, constipation, diarrhea, nausea and vomiting.  Genitourinary: Negative for difficulty urinating, dysuria, enuresis, frequency, genital sores, pelvic pain, urgency, vaginal bleeding and vaginal discharge.   Depression screen [facility] 2/9 06/16/2017 06/12/2016 05/25/2016  Decreased Interest 0 0 0  Down, Depressed, Hopeless 0 0 0  PHQ - 2 Score 0 0 0        Objective:   Physical Exam  Constitutional: She is oriented to person, place, and time. She appears well-developed. No distress.  HENT:  Right Ear: External ear normal.  Left Ear: External ear normal.  Mouth/Throat: Oropharynx is clear and moist.  Neck: Normal range of motion. Neck supple. No tracheal deviation present. No thyromegaly present.  Cardiovascular: Normal rate, regular rhythm and normal heart sounds. Exam reveals no gallop.  No murmur heard. Pulmonary/Chest: Effort normal and breath sounds normal. Right breast exhibits no inverted nipple, no mass, no skin change and no tenderness. Left breast exhibits no inverted nipple, no mass, no skin change and no tenderness. Breasts are symmetrical.  Axillae no adenopathy.   Abdominal: Soft. She exhibits no distension. There is no tenderness.  Genitourinary: Vagina normal and uterus normal. No  vaginal discharge found.  Genitourinary Comments: External GU: no rashes or lesions. Vagina: no discharge. Cervix normal in appearance. No CMT. Bimanual exam: no tenderness or obvious masses.   Musculoskeletal: She exhibits no edema.  Lymphadenopathy:    She has no cervical adenopathy.  Neurological: She is alert and oriented to person, place, and time.  Skin: Skin is warm and dry. No rash noted.  Psychiatric: She has a normal mood and affect. Her behavior is normal.  Vitals reviewed.         Assessment & Plan:   Problem List Items Addressed This Visit      Endocrine   Hypothyroidism   Relevant Orders   TSH    Other Visit Diagnoses    Well woman exam    -  Primary   Screening, lipid       Relevant Orders   Lipid panel   High risk medication use       Relevant Orders   Basic metabolic panel   Hepatic function panel     Routine labs ordered. Continue daily vitamin D, calcium, activity and weight loss efforts.  Return in about 1 year (around 06/16/2018) for physical.

## 2017-06-18 DIAGNOSIS — Z13220 Encounter for screening for lipoid disorders: Secondary | ICD-10-CM | POA: Diagnosis not present

## 2017-06-18 DIAGNOSIS — E039 Hypothyroidism, unspecified: Secondary | ICD-10-CM | POA: Diagnosis not present

## 2017-06-18 DIAGNOSIS — Z79899 Other long term (current) drug therapy: Secondary | ICD-10-CM | POA: Diagnosis not present

## 2017-06-19 LAB — HEPATIC FUNCTION PANEL
ALBUMIN: 4.7 g/dL (ref 3.6–4.8)
ALK PHOS: 100 IU/L (ref 39–117)
ALT: 31 IU/L (ref 0–32)
AST: 19 IU/L (ref 0–40)
Bilirubin Total: 0.4 mg/dL (ref 0.0–1.2)
Bilirubin, Direct: 0.12 mg/dL (ref 0.00–0.40)
TOTAL PROTEIN: 6.5 g/dL (ref 6.0–8.5)

## 2017-06-19 LAB — LIPID PANEL
CHOLESTEROL TOTAL: 145 mg/dL (ref 100–199)
Chol/HDL Ratio: 3.5 ratio (ref 0.0–4.4)
HDL: 42 mg/dL (ref >39)
LDL CALC: 90 mg/dL (ref 0–99)
Triglycerides: 66 mg/dL (ref 0–149)
VLDL CHOLESTEROL CAL: 13 mg/dL (ref 5–40)

## 2017-06-19 LAB — BASIC METABOLIC PANEL
BUN / CREAT RATIO: 18 (ref 12–28)
BUN: 13 mg/dL (ref 8–27)
CHLORIDE: 106 mmol/L (ref 96–106)
CO2: 21 mmol/L (ref 20–29)
CREATININE: 0.72 mg/dL (ref 0.57–1.00)
Calcium: 9.3 mg/dL (ref 8.7–10.3)
GFR calc Af Amer: 105 mL/min/{1.73_m2} (ref >59)
GFR calc non Af Amer: 91 mL/min/{1.73_m2} (ref >59)
GLUCOSE: 102 mg/dL — AB (ref 65–99)
POTASSIUM: 4.9 mmol/L (ref 3.5–5.2)
SODIUM: 144 mmol/L (ref 134–144)

## 2017-06-19 LAB — TSH: TSH: 1.19 u[IU]/mL (ref 0.450–4.500)

## 2017-06-26 ENCOUNTER — Ambulatory Visit (HOSPITAL_COMMUNITY)
Admission: RE | Admit: 2017-06-26 | Discharge: 2017-06-26 | Disposition: A | Discharge status: Home / Self Care | Payer: BLUE CROSS/BLUE SHIELD | Source: Ambulatory Visit | Attending: Gastroenterology | Admitting: Gastroenterology

## 2017-06-26 ENCOUNTER — Encounter (HOSPITAL_COMMUNITY): Payer: Self-pay | Admitting: *Deleted

## 2017-06-26 ENCOUNTER — Encounter (HOSPITAL_COMMUNITY)
Admission: RE | Disposition: A | Discharge status: Home / Self Care | Payer: Self-pay | Source: Ambulatory Visit | Attending: Gastroenterology

## 2017-06-26 ENCOUNTER — Other Ambulatory Visit: Payer: Self-pay

## 2017-06-26 DIAGNOSIS — R1032 Left lower quadrant pain: Secondary | ICD-10-CM

## 2017-06-26 DIAGNOSIS — Z1212 Encounter for screening for malignant neoplasm of rectum: Secondary | ICD-10-CM

## 2017-06-26 DIAGNOSIS — Z800 Family history of malignant neoplasm of digestive organs: Secondary | ICD-10-CM | POA: Insufficient documentation

## 2017-06-26 DIAGNOSIS — K5730 Diverticulosis of large intestine without perforation or abscess without bleeding: Secondary | ICD-10-CM | POA: Diagnosis not present

## 2017-06-26 DIAGNOSIS — Z8249 Family history of ischemic heart disease and other diseases of the circulatory system: Secondary | ICD-10-CM | POA: Diagnosis not present

## 2017-06-26 DIAGNOSIS — Z79899 Other long term (current) drug therapy: Secondary | ICD-10-CM | POA: Diagnosis not present

## 2017-06-26 DIAGNOSIS — K644 Residual hemorrhoidal skin tags: Secondary | ICD-10-CM | POA: Diagnosis not present

## 2017-06-26 DIAGNOSIS — Z1211 Encounter for screening for malignant neoplasm of colon: Secondary | ICD-10-CM

## 2017-06-26 DIAGNOSIS — E039 Hypothyroidism, unspecified: Secondary | ICD-10-CM | POA: Diagnosis not present

## 2017-06-26 DIAGNOSIS — R197 Diarrhea, unspecified: Secondary | ICD-10-CM

## 2017-06-26 HISTORY — PX: COLONOSCOPY: SHX5424

## 2017-06-26 SURGERY — COLONOSCOPY
Anesthesia: Moderate Sedation

## 2017-06-26 MED ORDER — SODIUM CHLORIDE 0.9 % IV SOLN
INTRAVENOUS | Status: DC
  Administered 2017-06-26: 12:00:00 via INTRAVENOUS

## 2017-06-26 MED ORDER — STERILE WATER FOR IRRIGATION IR SOLN
Status: DC | PRN
  Administered 2017-06-26: 13:00:00

## 2017-06-26 MED ORDER — MEPERIDINE HCL 100 MG/ML IJ SOLN
INTRAMUSCULAR | Status: DC | PRN
  Administered 2017-06-26 (×2): 25 mg via INTRAVENOUS
  Administered 2017-06-26: 50 mg via INTRAVENOUS

## 2017-06-26 MED ORDER — MIDAZOLAM HCL 5 MG/5ML IJ SOLN
INTRAMUSCULAR | Status: AC
  Filled 2017-06-26: qty 10

## 2017-06-26 MED ORDER — MIDAZOLAM HCL 5 MG/5ML IJ SOLN
INTRAMUSCULAR | Status: DC | PRN
  Administered 2017-06-26: 2 mg via INTRAVENOUS
  Administered 2017-06-26: 1 mg via INTRAVENOUS
  Administered 2017-06-26: 2 mg via INTRAVENOUS

## 2017-06-26 MED ORDER — MEPERIDINE HCL 100 MG/ML IJ SOLN
INTRAMUSCULAR | Status: AC
  Filled 2017-06-26: qty 2

## 2017-06-26 NOTE — H&P (Addendum)
Primary Care Physician:  Kathyrn Drown, MD Primary Gastroenterologist:  Dr. Oneida Alar  Pre-Procedure History & Physical: HPI:  Sandra Zavala is a 62 y.o. female here for [place]. ABDOMINAL PAIN AND DIARRHEA HAVE RESOLVED. NONE FOR PAST 6 WEEKS.  Past Medical History:  Diagnosis Date  . Hypothyroid     Past Surgical History:  Procedure Laterality Date  . CHOLECYSTECTOMY  1999  . TONSILECTOMY, ADENOIDECTOMY, BILATERAL MYRINGOTOMY AND TUBES  1962    Prior to Admission medications   Medication Sig Start Date End Date Taking? Authorizing Provider  calcium carbonate (OS-CAL) 600 MG TABS Take 600 mg by mouth 3 (three) times daily with meals.   Yes [provider]  Cholecalciferol (VITAMIN D3) 2000 UNITS TABS Take 2,000 Units by mouth 2 (two) times daily.    Yes [provider]  levothyroxine (SYNTHROID, LEVOTHROID) 125 MCG tablet Take 1 tablet by mouth  daily Patient taking differently: Take 125 mcg by mouth daily before breakfast.  08/19/16  Yes Nilda Simmer, NP  Multiple Vitamins-Minerals (ICAPS PO) Take 1 tablet by mouth daily.    Yes [provider]  Probiotic Product (PROBIOTIC PO) Take 1 tablet by mouth daily.    Yes [provider]  triamcinolone (NASACORT) 55 MCG/ACT AERO nasal inhaler Place 2 sprays into the nose daily.   Yes [provider]  vitamin C (ASCORBIC ACID) 500 MG tablet Take 500 mg by mouth daily.   Yes [provider]  albuterol (PROVENTIL HFA;VENTOLIN HFA) 108 (90 Base) MCG/ACT inhaler Inhale 2 puffs into the lungs every 4 (four) hours as needed for wheezing. Patient not taking: Reported on 06/16/2017 05/30/17   Kathyrn Drown, MD  ibuprofen (ADVIL,MOTRIN) 200 MG tablet Take 400-600 mg by mouth every 8 (eight) hours as needed for headache or mild pain.    [provider]    Allergies as of 06/03/2017  . (No Known Allergies)    Family History  Problem Relation Age of Onset  .  Osteoporosis Mother   . Heart disease Mother 73       stent  . Pancreatic cancer Father        unknown if primary pancreatic vs liver  . Liver cancer Father        unknown if primary pancreatic vs liver  . Colon cancer Neg Hx   . Gastric cancer Neg Hx   . Esophageal cancer Neg Hx     Social History   Socioeconomic History  . Marital status: Married    Spouse name: Not on file  . Number of children: Not on file  . Years of education: Not on file  . Highest education level: Not on file  Social Needs  . Financial resource strain: Not on file  . Food insecurity - worry: Not on file  . Food insecurity - inability: Not on file  . Transportation needs - medical: Not on file  . Transportation needs - non-medical: Not on file  Occupational History  . Not on file  Tobacco Use  . Smoking status: Never Smoker  . Smokeless tobacco: Never Used  Substance and Sexual Activity  . Alcohol use: No  . Drug use: No  . Sexual activity: Yes  Other Topics Concern  . Not on file  Social History Narrative  . Not on file    Review of Systems: See HPI, otherwise negative ROS   Physical Exam: BP (!) 141/82   Pulse 89   Temp 97.9 F (  36.6 C) (Oral)   Resp 12   Ht 5' (1.524 m)   Wt 180 lb (81.6 kg)   SpO2 99%   BMI 35.15 kg/m  General:   Alert,  pleasant and cooperative in NAD Head:  Normocephalic and atraumatic. Neck:  Supple; Lungs:  Clear throughout to auscultation.    Heart:  Regular rate and rhythm. Abdomen:  Soft, nontender and nondistended. Normal bowel sounds, without guarding, and without rebound.   Neurologic:  Alert and  oriented x4;  grossly normal neurologically.  Impression/Plan:     SCREENING  Plan:  1. TCS TODAY DISCUSSED PROCEDURE, BENEFITS, & RISKS: < 1% chance of medication reaction, bleeding, perforation, or rupture of spleen/liver.

## 2017-06-26 NOTE — Discharge Instructions (Signed)
YOU DID NOT HAVE ANY POLYPS.  You have MODERATE external hemorrhoids and diverticulosis in your left colon.   DRINK WATER TO KEEP YOUR URINE LIGHT YELLOW.  CONTINUE YOUR WEIGHT LOSS EFFORTS. A WEIGHT OF 150 LBS OR LESS  WILL KEEP YOUR BODY MASS INDEX(BMI) UNDER 30.  FOLLOW A HIGH FIBER DIET. AVOID ITEMS THAT CAUSE BLOATING & GAS. See info below.  YOU SHOULD AVOID POPCORN.  PLEASE CALL WITH QUESTIONS OR CONCERNS ESPECIALLY IF YOU THINK YOU HAVE DIVERTICULITIS.  Next colonoscopy in 10 years. Colonoscopy Care After Read the instructions outlined below and refer to this sheet in the next week. These discharge instructions provide you with general information on caring for yourself after you leave the hospital. While your treatment has been planned according to the most current medical practices available, unavoidable complications occasionally occur. If you have any problems or questions after discharge, call DR. Cayetano Mikita, [phone].  ACTIVITY  You may resume your regular activity, but move at a slower pace for the next 24 hours.   Take frequent rest periods for the next 24 hours.   Walking will help get rid of the air and reduce the bloated feeling in your belly (abdomen).   No driving for 24 hours (because of the medicine (anesthesia) used during the test).   You may shower.   Do not sign any important legal documents or operate any machinery for 24 hours (because of the anesthesia used during the test).    NUTRITION  Drink plenty of fluids.   You may resume your normal diet as instructed by your doctor.   Begin with a light meal and progress to your normal diet. Heavy or fried foods are harder to digest and may make you feel sick to your stomach (nauseated).   Avoid alcoholic beverages for 24 hours or as instructed.    MEDICATIONS  You may resume your normal medications.   WHAT YOU CAN EXPECT TODAY  Some feelings of bloating in the abdomen.   Passage of more gas  than usual.   Spotting of blood in your stool or on the toilet paper  .  IF YOU HAD POLYPS REMOVED DURING THE COLONOSCOPY:  Eat a soft diet IF YOU HAVE NAUSEA, BLOATING, ABDOMINAL PAIN, OR VOMITING.    FINDING OUT THE RESULTS OF YOUR TEST Not all test results are available during your visit. DR. Oneida Alar WILL CALL YOU WITHIN 7 DAYS OF YOUR PROCEDUE WITH YOUR RESULTS. Do not assume everything is normal if you have not heard from DR. Aslan Montagna IN ONE WEEK, CALL HER OFFICE AT [phone].  SEEK IMMEDIATE MEDICAL ATTENTION AND CALL THE OFFICE: [phone] IF:  You have more than a spotting of blood in your stool.   Your belly is swollen (abdominal distention).   You are nauseated or vomiting.   You have a temperature over 101F.   You have abdominal pain or discomfort that is severe or gets worse throughout the day.  High-Fiber Diet A high-fiber diet changes your normal diet to include more whole grains, legumes, fruits, and vegetables. Changes in the diet involve replacing refined carbohydrates with unrefined foods. The calorie level of the diet is essentially unchanged. The Dietary Reference Intake (recommended amount) for adult males is 38 grams per day. For adult females, it is 25 grams per day. Pregnant and lactating women should consume 28 grams of fiber per day. Fiber is the intact part of a plant that is not broken down during digestion. Functional fiber is fiber that  has been isolated from the plant to provide a beneficial effect in the body. PURPOSE  Increase stool bulk.   Ease and regulate bowel movements.   Lower cholesterol.  INDICATIONS THAT YOU NEED MORE FIBER  Constipation and hemorrhoids.   Uncomplicated diverticulosis (intestine condition) and irritable bowel syndrome.   Weight management.   As a protective measure against hardening of the arteries (atherosclerosis), diabetes, and cancer.   GUIDELINES FOR INCREASING FIBER IN THE DIET  Start adding fiber to the  diet slowly. A gradual increase of about 5 more grams (2 slices of whole-wheat bread, 2 servings of most fruits or vegetables, or 1 bowl of high-fiber cereal) per day is best. Too rapid an increase in fiber may result in constipation, flatulence, and bloating.   Drink enough water and fluids to keep your urine clear or pale yellow. Water, juice, or caffeine-free drinks are recommended. Not drinking enough fluid may cause constipation.   Eat a variety of high-fiber foods rather than one type of fiber.   Try to increase your intake of fiber through using high-fiber foods rather than fiber pills or supplements that contain small amounts of fiber.   The goal is to change the types of food eaten. Do not supplement your present diet with high-fiber foods, but replace foods in your present diet.   INCLUDE A VARIETY OF FIBER SOURCES  Replace refined and processed grains with whole grains, canned fruits with fresh fruits, and incorporate other fiber sources. White rice, white breads, and most bakery goods contain little or no fiber.   Brown whole-grain rice, buckwheat oats, and many fruits and vegetables are all good sources of fiber. These include: broccoli, Brussels sprouts, cabbage, cauliflower, beets, sweet potatoes, white potatoes (skin on), carrots, tomatoes, eggplant, squash, berries, fresh fruits, and dried fruits.   Cereals appear to be the richest source of fiber. Cereal fiber is found in whole grains and bran. Bran is the fiber-rich outer coat of cereal grain, which is largely removed in refining. In whole-grain cereals, the bran remains. In breakfast cereals, the largest amount of fiber is found in those with "bran" in their names. The fiber content is sometimes indicated on the label.   You may need to include additional fruits and vegetables each day.   In baking, for 1 cup white flour, you may use the following substitutions:   1 cup whole-wheat flour minus 2 tablespoons.   1/2 cup  white flour plus 1/2 cup whole-wheat flour.   Diverticulosis Diverticulosis is a common condition that develops when small pouches (diverticula) form in the wall of the colon. The risk of diverticulosis increases with age. It happens more often in people who eat a low-fiber diet. Most individuals with diverticulosis have no symptoms. Those individuals with symptoms usually experience belly (abdominal) pain, constipation, or loose stools (diarrhea).  HOME CARE INSTRUCTIONS  Increase the amount of fiber in your diet as directed by your caregiver or dietician. This may reduce symptoms of diverticulosis.   Drink at least 6 to 8 glasses of water each day to prevent constipation.   Try not to strain when you have a bowel movement.   Avoiding nuts and seeds to prevent complications is NOT NECESSARY.   FOODS HAVING HIGH FIBER CONTENT INCLUDE:  Fruits. Apple, peach, pear, tangerine, raisins, prunes.   Vegetables. Brussels sprouts, asparagus, broccoli, cabbage, carrot, cauliflower, romaine lettuce, spinach, summer squash, tomato, winter squash, zucchini.   Starchy Vegetables. Baked beans, kidney beans, lima beans, split peas, lentils, potatoes (  with skin).   Grains. Whole wheat bread, brown rice, bran flake cereal, plain oatmeal, white rice, shredded wheat, bran muffins.    SEEK IMMEDIATE MEDICAL CARE IF:  You develop increasing pain or severe bloating.   You have an oral temperature above 101F.   You develop vomiting or bowel movements that are bloody or black.   Hemorrhoids Hemorrhoids are dilated (enlarged) veins around the rectum. Sometimes clots will form in the veins. This makes them swollen and painful. These are called thrombosed hemorrhoids. Causes of hemorrhoids include:  Constipation.   Straining to have a bowel movement.   HEAVY LIFTING  HOME CARE INSTRUCTIONS  Eat a well balanced diet and drink 6 to 8 glasses of water every day to avoid constipation. You may also use  a bulk laxative.   Avoid straining to have bowel movements.   Keep anal area dry and clean.   Do not use a donut shaped pillow or sit on the toilet for long periods. This increases blood pooling and pain.   Move your bowels when your body has the urge; this will require less straining and will decrease pain and pressure.

## 2017-06-26 NOTE — Op Note (Signed)
[facility] Patient Name: Sandra Zavala Procedure Date: 06/26/2017 12:08 PM MRN: [id number] Date of Birth: December 26, 1955 Attending MD: Barney Drain MD, MD CSN: [id number] Age: 62 Admit Type: Outpatient Procedure:                Colonoscopy, SCREENING Indications:              Screening for colorectal malignant neoplasm Providers:                Barney Drain MD, MD, Otis Peak B. Sharon Seller, RN,                            Randa Spike, Technician Referring MD:             Elayne Snare. Luking Medicines:                Meperidine 100 mg IV, Midazolam 5 mg IV Complications:            No immediate complications. Estimated Blood Loss:     Estimated blood loss: none. Procedure:                Pre-Anesthesia Assessment:                           - Prior to the procedure, a History and Physical                            was performed, and patient medications and                            allergies were reviewed. The patient's tolerance of                            previous anesthesia was also reviewed. The risks                            and benefits of the procedure and the sedation                            options and risks were discussed with the patient.                            All questions were answered, and informed consent                            was obtained. Prior Anticoagulants: The patient has                            taken no previous anticoagulant or antiplatelet                            agents. ASA Grade Assessment: II - A patient with                            mild systemic disease. After reviewing the risks  and benefits, the patient was deemed in                            satisfactory condition to undergo the procedure.                            After obtaining informed consent, the colonoscope                            was passed under direct vision. Throughout the                            procedure, the patient's blood pressure,  pulse, and                            oxygen saturations were monitored continuously. The                            EC-[id number] ([id number]) scope was introduced through                            the anus and advanced to the the cecum, identified                            by appendiceal orifice and ileocecal valve. The                            colonoscopy was technically difficult and complex                            due to restricted mobility of the colon. Successful                            completion of the procedure was aided by                            straightening and shortening the scope to obtain                            bowel loop reduction and COLOWRAP. The patient                            tolerated the procedure fairly well. The quality of                            the bowel preparation was good. The ileocecal                            valve, appendiceal orifice, and rectum were                            photographed. Scope In: 12:41:21 PM Scope Out: 1:02:11 PM Scope Withdrawal Time: 0 hours 16 minutes 28 seconds  Total Procedure Duration: 0  hours 20 minutes 50 seconds  Findings:      The recto-sigmoid colon and sigmoid colon were moderately redundant.      Multiple small and large-mouthed diverticula were found in the       recto-sigmoid colon, sigmoid colon, descending colon and splenic flexure.      External hemorrhoids were found during retroflexion. The hemorrhoids       were moderate. Impression:               - Redundant/RESTRICTED RECTOSIGMOID colon.                           - MODERATE Diverticulosis in the recto-sigmoid                            colon, in the sigmoid colon, in the descending                            colon and at the splenic flexure.                           - External hemorrhoids. Moderate Sedation:      Moderate (conscious) sedation was administered by the endoscopy nurse       and supervised by the endoscopist. The following  parameters were       monitored: oxygen saturation, heart rate, blood pressure, and response       to care. Total physician intraservice time was 32 minutes. Recommendation:           - Repeat colonoscopy in 10 years for surveillance.                           - High fiber diet.                           - Continue present medications.                           - Patient has a contact number available for                            emergencies. The signs and symptoms of potential                            delayed complications were discussed with the                            patient. Return to normal activities tomorrow.                            Written discharge instructions were provided to the                            patient. Procedure Code(s):        --- Professional ---                           [id number], Colonoscopy, flexible; diagnostic, including  collection of specimen(s) by brushing or washing,                            when performed (separate procedure)                           99152, Moderate sedation services provided by the                            same physician or other qualified health care                            professional performing the diagnostic or                            therapeutic service that the sedation supports,                            requiring the presence of an independent trained                            observer to assist in the monitoring of the                            patient's level of consciousness and physiological                            status; initial 15 minutes of intraservice time,                            patient age 35 years or older                           [id number], Moderate sedation services; each additional                            15 minutes intraservice time Diagnosis Code(s):        --- Professional ---                           Z12.11, Encounter for screening for malignant                             neoplasm of colon                           K64.4, Residual hemorrhoidal skin tags                           K57.30, Diverticulosis of large intestine without                            perforation or abscess without bleeding                           Q43.8, Other  specified congenital malformations of                            intestine CPT copyright 2016 American Medical Association. All rights reserved. The codes documented in this report are preliminary and upon coder review may  be revised to meet current compliance requirements. Barney Drain, MD Barney Drain MD, MD 06/26/2017 1:08:19 PM This report has been signed electronically. Number of Addenda: 0

## 2017-06-30 ENCOUNTER — Encounter (HOSPITAL_COMMUNITY): Payer: Self-pay | Admitting: Gastroenterology

## 2017-07-30 ENCOUNTER — Other Ambulatory Visit: Payer: Self-pay | Admitting: Nurse Practitioner

## 2017-07-30 ENCOUNTER — Encounter: Payer: Self-pay | Admitting: Nurse Practitioner

## 2017-07-30 MED ORDER — AMOXICILLIN-POT CLAVULANATE 875-125 MG PO TABS: 1.0000 | ORAL_TABLET | Freq: Two times a day (BID) | ORAL | 0 refills | Status: DC

## 2017-08-21 ENCOUNTER — Other Ambulatory Visit: Payer: Self-pay | Admitting: Nurse Practitioner

## 2017-08-21 ENCOUNTER — Encounter: Payer: Self-pay | Admitting: Nurse Practitioner

## 2017-08-21 MED ORDER — LEVOTHYROXINE SODIUM 125 MCG PO TABS: ORAL_TABLET | ORAL | 3 refills | Status: DC

## 2017-09-02 ENCOUNTER — Encounter: Payer: Self-pay | Admitting: Nurse Practitioner

## 2017-09-02 ENCOUNTER — Ambulatory Visit: Payer: BLUE CROSS/BLUE SHIELD | Admitting: Nurse Practitioner

## 2017-09-02 VITALS — BP 131/77 | HR 71 | Temp 98.2°F | Ht 60.0 in | Wt 176.6 lb

## 2017-09-02 DIAGNOSIS — R197 Diarrhea, unspecified: Secondary | ICD-10-CM

## 2017-09-02 DIAGNOSIS — R1032 Left lower quadrant pain: Secondary | ICD-10-CM

## 2017-09-02 NOTE — Progress Notes (Signed)
Referring Provider: Kathyrn Drown, MD Primary Care Physician:  Kathyrn Drown, MD Primary GI:  Dr. Oneida Alar  Chief Complaint  Patient presents with  . Abdominal Pain    improved  . Diarrhea    improved    HPI:   Sandra Zavala is a 62 y.o. female who presents for follow-up on abdominal pain and diarrhea.  The patient was last seen in our office 06/03/2017 for left lower quadrant pain and diarrhea.  Last colonoscopy completed 05/15/2007 recommended 10-year repeat exam.  At her last visit she noted she was having a cough as she is recently battled the flu.  Started having watery/yellow stools, think she has IBS.  Possible one episode of diverticulitis to previous year.  Left lower quadrant discomfort which is frequent and vague.  Kidney stone and ovarian work-up negative.  Pain improves after bowel movement.  Diarrhea 2-4 times in the morning and then none later in the day.  No other GI symptoms.  Recommended stool studies, symptomatic management pending stool study results, colonoscopy, follow-up in 3 months.  GI pathogen panel was negative.  Will be called to relay results, she stated her symptoms were improving but she would call back if she needed Bentyl.  Colonoscopy completed 06/26/2017 which found redundant rectosigmoid colon, moderate diverticulosis in the rectosigmoid colon, sigmoid colon, descending colon, splenic flexure.  Noted external hemorrhoids.  Recommended high-fiber diet, continue medications, repeat colonoscopy in 10 years.  Today she states she's doing better. Currently "normal days" far outweight abnormal days. Has had 2 days of constipation. Minor/rare "slight twinges" on the LLQ side only about 4 times since last visit (about 1 a month); not particularly bothersome. On days where she (rarely) has diarrhea, typically just once a day then back to normal. Denies ongoing abdominal pain, N/V, hematochezia, melena. Denies chest pain, dyspnea, dizziness, lightheadedness, syncope,  near syncope. Denies any other upper or lower GI symptoms.  Past Medical History:  Diagnosis Date  . Hypothyroid     Past Surgical History:  Procedure Laterality Date  . CHOLECYSTECTOMY  1999  . COLONOSCOPY N/A 06/26/2017   Procedure: COLONOSCOPY;  Surgeon: Danie Binder, MD;  Location: [facility];  Service: Endoscopy;  Laterality: N/A;  12:45pm  . TONSILECTOMY, ADENOIDECTOMY, BILATERAL MYRINGOTOMY AND TUBES  1962    Current Outpatient Medications  Medication Sig Dispense Refill  . calcium carbonate (OS-CAL) 600 MG TABS Take 600 mg by mouth 3 (three) times daily with meals.    . Cholecalciferol (VITAMIN D3) 2000 UNITS TABS Take 2,000 Units by mouth 2 (two) times daily.     [name] ibuprofen (ADVIL,MOTRIN) 200 MG tablet Take 400-600 mg by mouth every 8 (eight) hours as needed for headache or mild pain.    [name] levothyroxine (SYNTHROID, LEVOTHROID) 125 MCG tablet Take 1 tablet by mouth  daily 90 tablet 3  . Multiple Vitamins-Minerals (ICAPS PO) Take 1 tablet by mouth daily.     . Probiotic Product (PROBIOTIC PO) Take 1 tablet by mouth daily.     [name] triamcinolone (NASACORT) 55 MCG/ACT AERO nasal inhaler Place 2 sprays into the nose daily.    . vitamin C (ASCORBIC ACID) 500 MG tablet Take 500 mg by mouth daily.     No current facility-administered medications for this visit.     Allergies as of 09/02/2017  . (No Known Allergies)    Family History  Problem Relation Age of Onset  . Osteoporosis Mother   . Heart disease Mother 70  stent  . Pancreatic cancer Father        unknown if primary pancreatic vs liver  . Liver cancer Father        unknown if primary pancreatic vs liver  . Colon cancer Neg Hx   . Gastric cancer Neg Hx   . Esophageal cancer Neg Hx     Social History   Socioeconomic History  . Marital status: Married    Spouse name: Not on file  . Number of children: Not on file  . Years of education: Not on file  . Highest education level: Not on file    Occupational History  . Not on file  Social Needs  . Financial resource strain: Not on file  . Food insecurity:    Worry: Not on file    Inability: Not on file  . Transportation needs:    Medical: Not on file    Non-medical: Not on file  Tobacco Use  . Smoking status: Never Smoker  . Smokeless tobacco: Never Used  Substance and Sexual Activity  . Alcohol use: No  . Drug use: No  . Sexual activity: Yes  Lifestyle  . Physical activity:    Days per week: Not on file    Minutes per session: Not on file  . Stress: Not on file  Relationships  . Social connections:    Talks on phone: Not on file    Gets together: Not on file    Attends religious service: Not on file    Active member of club or organization: Not on file    Attends meetings of clubs or organizations: Not on file    Relationship status: Not on file  Other Topics Concern  . Not on file  Social History Narrative  . Not on file    Review of Systems: Complete ROS negative except as per HPI.   Physical Exam: BP 131/77   Pulse 71   Temp 98.2 F (36.8 C) (Oral)   Ht 5' (1.524 m)   Wt 176 lb 9.6 oz (80.1 kg)   BMI 34.49 kg/m  General:   Alert and oriented. Pleasant and cooperative. Well-nourished and well-developed.  Eyes:  Without icterus, sclera clear and conjunctiva pink.  Ears:  Normal auditory acuity. Cardiovascular:  S1, S2 present without murmurs appreciated. Extremities without clubbing or edema. Respiratory:  Clear to auscultation bilaterally. No wheezes, rales, or rhonchi. No distress.  Gastrointestinal:  +BS, soft, non-tender and non-distended. No HSM noted. No guarding or rebound. No masses appreciated.  Rectal:  Deferred  Musculoskalatal:  Symmetrical without gross deformities. Neurologic:  Alert and oriented x4;  grossly normal neurologically. Psych:  Alert and cooperative. Normal mood and affect. Heme/Lymph/Immune: No excessive bruising noted.    09/02/2017 9:42 AM   Disclaimer: This  note was dictated with voice recognition software. Similar sounding words can inadvertently be transcribed and may not be corrected upon review.

## 2017-09-02 NOTE — Assessment & Plan Note (Signed)
Diarrhea is significantly improved.  "Normal" days far out number the "abnormal" days.  Continue current medications.  Follow-up in 1 year.  Call if any worsening symptoms before then.

## 2017-09-02 NOTE — Patient Instructions (Signed)
1. Continue taking your current medications. 2. Return for follow-up in 1 year. 3. Call us if you have any questions or concerns.   At [facility] we value your feedback. You may receive a survey about your visit today. Please share your experience as we strive to create trusting relationships with our patients to provide genuine, compassionate, quality care.   It was great to see you today!  I am glad you are doing better, have a wonderful summer!!!

## 2017-09-02 NOTE — Assessment & Plan Note (Signed)
Left lower quadrant abdominal pain is significantly improved.  She does occasionally have a "small twinge" which occurs about once a month.  It is not overtly bothersome.  She is happy with her progress.  Recommend she continue her current medications, specifically probiotic.  Call us if she has any worsening symptoms.  Follow-up in 1 year.

## 2017-09-02 NOTE — Progress Notes (Signed)
cc'ed to pcp °

## 2017-10-24 ENCOUNTER — Other Ambulatory Visit: Payer: Self-pay | Admitting: Nurse Practitioner

## 2017-10-24 DIAGNOSIS — Z1231 Encounter for screening mammogram for malignant neoplasm of breast: Secondary | ICD-10-CM

## 2017-11-27 ENCOUNTER — Ambulatory Visit (HOSPITAL_COMMUNITY)
Admission: RE | Admit: 2017-11-27 | Discharge: 2017-11-27 | Disposition: A | Discharge status: Home / Self Care | Payer: BLUE CROSS/BLUE SHIELD | Source: Ambulatory Visit | Attending: Nurse Practitioner | Admitting: Nurse Practitioner

## 2017-11-27 DIAGNOSIS — Z1231 Encounter for screening mammogram for malignant neoplasm of breast: Secondary | ICD-10-CM | POA: Diagnosis not present

## 2018-03-26 ENCOUNTER — Encounter: Payer: Self-pay | Admitting: Gastroenterology

## 2018-03-26 ENCOUNTER — Telehealth: Payer: Self-pay | Admitting: Gastroenterology

## 2018-03-26 ENCOUNTER — Ambulatory Visit (INDEPENDENT_AMBULATORY_CARE_PROVIDER_SITE_OTHER): Payer: BLUE CROSS/BLUE SHIELD | Admitting: Gastroenterology

## 2018-03-26 DIAGNOSIS — K5792 Diverticulitis of intestine, part unspecified, without perforation or abscess without bleeding: Secondary | ICD-10-CM | POA: Insufficient documentation

## 2018-03-26 MED ORDER — CIPROFLOXACIN HCL 500 MG PO TABS: 500.0000 mg | ORAL_TABLET | Freq: Two times a day (BID) | ORAL | 0 refills | Status: AC

## 2018-03-26 MED ORDER — METRONIDAZOLE 500 MG PO TABS: 500.0000 mg | ORAL_TABLET | Freq: Three times a day (TID) | ORAL | 0 refills | Status: AC

## 2018-03-26 NOTE — Progress Notes (Signed)
Primary Care Physician: Kathyrn Drown, MD  Primary Gastroenterologist:  Barney Drain, MD   Chief Complaint  Patient presents with  . Bloated  . Abdominal Pain    started feeling achy on Tuesday, intense pain spams started that night, llq feels very tender/sore  . Chills    on tuesday but no fever  . Anorexia  . Diarrhea    HPI: Sandra Zavala is a 62 y.o. female presenting for same-day appointment for left lower quadrant abdominal pain, chills.  She was last seen in April.  She had a colonoscopy back in February noted to have redundant rectosigmoid colon, moderate diverticulosis in the rectosigmoid colon, sigmoid colon, descending colon, splenic flexure.  External hemorrhoids noted.  Next colonoscopy in February 2029.Patient reports baseline intermittent diarrhea felt to be related to IBS.  2 days ago she developed intense lower abdominal discomfort/bloating, predominantly left lower quadrant.  No documented fever but she has been having chills.  Loss of appetite.  At onset of symptoms she changed to a soft diet.  Continues to have regular BMs like her baseline.  No melena or rectal bleeding.  Notes that she has had some nuts lately.  Otherwise diet has been unchanged.  Tries to eat a high-fiber diet, takes a fiber supplement as well.    Current Outpatient Medications  Medication Sig Dispense Refill  . calcium carbonate (OS-CAL) 600 MG TABS Take 600 mg by mouth 3 (three) times daily with meals.    . Cholecalciferol (VITAMIN D3) 2000 UNITS TABS Take 2,000 Units by mouth 2 (two) times daily.     [name] FIBER ADULT GUMMIES PO Take by mouth. Twice a week    . ibuprofen (ADVIL,MOTRIN) 200 MG tablet Take 400-600 mg by mouth every 8 (eight) hours as needed for headache or mild pain.    [name] levothyroxine (SYNTHROID, LEVOTHROID) 125 MCG tablet Take 1 tablet by mouth  daily 90 tablet 3  . Multiple Vitamins-Minerals (ICAPS PO) Take 1 tablet by mouth daily.     . Probiotic Product (PROBIOTIC  PO) Take 1 tablet by mouth daily.     [name] triamcinolone (NASACORT) 55 MCG/ACT AERO nasal inhaler Place 2 sprays into the nose daily.    . vitamin C (ASCORBIC ACID) 500 MG tablet Take 500 mg by mouth daily.     No current facility-administered medications for this visit.     Allergies as of 03/26/2018  . (No Known Allergies)    ROS:  General: Negative for weight loss, fever, fatigue, weakness.  See HPI ENT: Negative for hoarseness, difficulty swallowing , nasal congestion. CV: Negative for chest pain, angina, palpitations, dyspnea on exertion, peripheral edema.  Respiratory: Negative for dyspnea at rest, dyspnea on exertion, cough, sputum, wheezing.  GI: See history of present illness. GU:  Negative for dysuria, hematuria, urinary incontinence, urinary frequency, nocturnal urination.  Endo: Negative for unusual weight change.    Physical Examination:   BP 131/82   Pulse 80   Temp (!) 97.5 F (36.4 C) (Oral)   Ht 5' (1.524 m)   Wt 181 lb 3.2 oz (82.2 kg)   BMI 35.39 kg/m   General: Well-nourished, well-developed in no acute distress.  Eyes: No icterus. Mouth: Oropharyngeal mucosa moist and pink , no lesions erythema or exudate. Lungs: Clear to auscultation bilaterally.  Heart: Regular rate and rhythm, no murmurs rubs or gallops.  Abdomen: Bowel sounds are normal,nondistended, no hepatosplenomegaly or masses, no abdominal bruits or hernia , no rebound  or guarding.  Mild to moderate left lower quadrant tenderness Extremities: No lower extremity edema. No clubbing or deformities. Neuro: Alert and oriented x 4   Skin: Warm and dry, no jaundice.   Psych: Alert and cooperative, normal mood and affect.

## 2018-03-26 NOTE — Telephone Encounter (Signed)
noted 

## 2018-03-26 NOTE — Telephone Encounter (Signed)
Pt called asking to speak with EG or nurse. She thinks she is having a diverticulitis flare up. I offered her OV with EG on Monday, but she wants to see if EG could call in something (antibiotic) instead of having to come in. Please advise [phone]

## 2018-03-26 NOTE — Telephone Encounter (Signed)
Pt thinks she is having a diverticulitis flare. Been having some problems since Tuesday this week. Started out with bloating and aching. Chills some, but no fever.  No appetite and BM's a little more on constipation side, but no blood in stool. Spasms in abdomen and pain in mid abdomen below belly button and to the left side some. The pain is intermittent.  We had a cancellation and she will come in to see Neil Crouch, PA at 11:00 am today.

## 2018-03-26 NOTE — Patient Instructions (Signed)
1. Continue soft low fiber diet until you are feeling better. Once you complete antibiotics you can start adding back in more fiber.  2. Complete 10 day course of cipro and flagyl. Call if you have ongoing symptoms. If your pain does not resolve, we would consider CT scan or change in antibiotic therapy.    Diverticulitis Diverticulitis is infection or inflammation of small pouches (diverticula) in the colon that form due to a condition called diverticulosis. Diverticula can trap stool (feces) and bacteria, causing infection and inflammation. Diverticulitis may cause severe stomach pain and diarrhea. It may lead to tissue damage in the colon that causes bleeding. The diverticula may also burst (rupture) and cause infected stool to enter other areas of the abdomen. Complications of diverticulitis can include:  Bleeding.  Severe infection.  Severe pain.  Rupture (perforation) of the colon.  Blockage (obstruction) of the colon.  What are the causes? This condition is caused by stool becoming trapped in the diverticula, which allows bacteria to grow in the diverticula. This leads to inflammation and infection. What increases the risk? You are more likely to develop this condition if:  You have diverticulosis. The risk for diverticulosis increases if: ? You are overweight or obese. ? You use tobacco products. ? You do not get enough exercise.  You eat a diet that does not include enough fiber. High-fiber foods include fruits, vegetables, beans, nuts, and whole grains.  What are the signs or symptoms? Symptoms of this condition may include:  Pain and tenderness in the abdomen. The pain is normally located on the left side of the abdomen, but it may occur in other areas.  Fever and chills.  Bloating.  Cramping.  Nausea.  Vomiting.  Changes in bowel routines.  Blood in your stool.  How is this diagnosed? This condition is diagnosed based on:  Your medical history.  A  physical exam.  Tests to make sure there is nothing else causing your condition. These tests may include: ? Blood tests. ? Urine tests. ? Imaging tests of the abdomen, including X-rays, ultrasounds, MRIs, or CT scans.  How is this treated? Most cases of this condition are mild and can be treated at home. Treatment may include:  Taking over-the-counter pain medicines.  Following a clear liquid diet.  Taking antibiotic medicines by mouth.  Rest.  More severe cases may need to be treated at a hospital. Treatment may include:  Not eating or drinking.  Taking prescription pain medicine.  Receiving antibiotic medicines through an IV tube.  Receiving fluids and nutrition through an IV tube.  Surgery.  When your condition is under control, your health care provider may recommend that you have a colonoscopy. This is an exam to look at the entire large intestine. During the exam, a lubricated, bendable tube is inserted into the anus and then passed into the rectum, colon, and other parts of the large intestine. A colonoscopy can show how severe your diverticula are and whether something else may be causing your symptoms. Follow these instructions at home: Medicines  Take over-the-counter and prescription medicines only as told by your health care provider. These include fiber supplements, probiotics, and stool softeners.  If you were prescribed an antibiotic medicine, take it as told by your health care provider. Do not stop taking the antibiotic even if you start to feel better.  Do not drive or use heavy machinery while taking prescription pain medicine. General instructions  Follow a full liquid diet or another diet as  directed by your health care provider. After your symptoms improve, your health care provider may tell you to change your diet. He or she may recommend that you eat a diet that contains at least 25 g (25 grams) of fiber daily. Fiber makes it easier to pass stool.  Healthy sources of fiber include: ? Berries. One cup contains 4-8 grams of fiber. ? Beans or lentils. One half cup contains 5-8 grams of fiber. ? Green vegetables. One cup contains 4 grams of fiber.  Exercise for at least 30 minutes, 3 times each week. You should exercise hard enough to raise your heart rate and break a sweat.  Keep all follow-up visits as told by your health care provider. This is important. You may need a colonoscopy. Contact a health care provider if:  Your pain does not improve.  You have a hard time drinking or eating food.  Your bowel movements do not return to normal. Get help right away if:  Your pain gets worse.  Your symptoms do not get better with treatment.  Your symptoms suddenly get worse.  You have a fever.  You vomit more than one time.  You have stools that are bloody, black, or tarry. Summary  Diverticulitis is infection or inflammation of small pouches (diverticula) in the colon that form due to a condition called diverticulosis. Diverticula can trap stool (feces) and bacteria, causing infection and inflammation.  You are at higher risk for this condition if you have diverticulosis and you eat a diet that does not include enough fiber.  Most cases of this condition are mild and can be treated at home. More severe cases may need to be treated at a hospital.  When your condition is under control, your health care provider may recommend that you have an exam called a colonoscopy. This exam can show how severe your diverticula are and whether something else may be causing your symptoms. This information is not intended to replace advice given to you by your health care provider. Make sure you discuss any questions you have with your health care provider. Document Released: 01/30/2005 Document Revised: 05/25/2016 Document Reviewed: 05/25/2016 Elsevier Interactive Patient Education  [employer].

## 2018-03-27 NOTE — Progress Notes (Signed)
cc'ed to pcp °

## 2018-03-27 NOTE — Assessment & Plan Note (Addendum)
Suspected acute diverticulitis.  Similar episode last year.  Well-documented diverticulosis on recent colonoscopy.  No prior CT imaging.  We discussed empirical treatment versus imaging and at this time she prefers first empiric antibiotics.  If she does not have complete resolution of her symptoms, she will need CT imaging.  Patient voiced understanding.  Start Cipro and Flagyl.  Continue soft low residue diet until resolution of symptoms.  Then incorporate high-fiber diet with fiber supplement.  She will call with any worsening or ongoing symptoms.

## 2018-05-15 ENCOUNTER — Telehealth: Payer: Self-pay | Admitting: Family Medicine

## 2018-05-15 DIAGNOSIS — Z13220 Encounter for screening for lipoid disorders: Secondary | ICD-10-CM

## 2018-05-15 DIAGNOSIS — Z79899 Other long term (current) drug therapy: Secondary | ICD-10-CM

## 2018-05-15 DIAGNOSIS — R739 Hyperglycemia, unspecified: Secondary | ICD-10-CM

## 2018-05-15 DIAGNOSIS — E039 Hypothyroidism, unspecified: Secondary | ICD-10-CM

## 2018-05-15 NOTE — Telephone Encounter (Signed)
Pt has CPE scheduled for 2/13. She would like to have lab work ordered before physical with Ria Comment.  CB# [phone]

## 2018-05-15 NOTE — Telephone Encounter (Signed)
Lipid, liver, met 7, TSH, A1c   Thanks!

## 2018-05-15 NOTE — Telephone Encounter (Signed)
Blood work ordered in Epic. Patient notified. 

## 2018-06-10 DIAGNOSIS — Z13220 Encounter for screening for lipoid disorders: Secondary | ICD-10-CM | POA: Diagnosis not present

## 2018-06-10 DIAGNOSIS — R739 Hyperglycemia, unspecified: Secondary | ICD-10-CM | POA: Diagnosis not present

## 2018-06-10 DIAGNOSIS — Z79899 Other long term (current) drug therapy: Secondary | ICD-10-CM | POA: Diagnosis not present

## 2018-06-10 DIAGNOSIS — E039 Hypothyroidism, unspecified: Secondary | ICD-10-CM | POA: Diagnosis not present

## 2018-06-11 LAB — HEPATIC FUNCTION PANEL
ALBUMIN: 4.7 g/dL (ref 3.8–4.8)
ALK PHOS: 113 IU/L (ref 39–117)
ALT: 36 IU/L — ABNORMAL HIGH (ref 0–32)
AST: 21 IU/L (ref 0–40)
BILIRUBIN TOTAL: 0.5 mg/dL (ref 0.0–1.2)
Bilirubin, Direct: 0.12 mg/dL (ref 0.00–0.40)
Total Protein: 6.3 g/dL (ref 6.0–8.5)

## 2018-06-11 LAB — LIPID PANEL
Chol/HDL Ratio: 3 ratio (ref 0.0–4.4)
Cholesterol, Total: 177 mg/dL (ref 100–199)
HDL: 59 mg/dL (ref >39)
LDL Calculated: 104 mg/dL — ABNORMAL HIGH (ref 0–99)
Triglycerides: 69 mg/dL (ref 0–149)
VLDL CHOLESTEROL CAL: 14 mg/dL (ref 5–40)

## 2018-06-11 LAB — BASIC METABOLIC PANEL
BUN / CREAT RATIO: 26 (ref 12–28)
BUN: 17 mg/dL (ref 8–27)
CO2: 21 mmol/L (ref 20–29)
Calcium: 9.2 mg/dL (ref 8.7–10.3)
Chloride: 104 mmol/L (ref 96–106)
Creatinine, Ser: 0.65 mg/dL (ref 0.57–1.00)
GFR calc Af Amer: 111 mL/min/{1.73_m2} (ref >59)
GFR, EST NON AFRICAN AMERICAN: 96 mL/min/{1.73_m2} (ref >59)
GLUCOSE: 90 mg/dL (ref 65–99)
Potassium: 4.2 mmol/L (ref 3.5–5.2)
SODIUM: 141 mmol/L (ref 134–144)

## 2018-06-11 LAB — HEMOGLOBIN A1C
Est. average glucose Bld gHb Est-mCnc: 108 mg/dL
HEMOGLOBIN A1C: 5.4 % (ref 4.8–5.6)

## 2018-06-11 LAB — TSH: TSH: 1.29 u[IU]/mL (ref 0.450–4.500)

## 2018-06-17 ENCOUNTER — Encounter: Payer: Self-pay | Admitting: Family Medicine

## 2018-06-17 ENCOUNTER — Ambulatory Visit (INDEPENDENT_AMBULATORY_CARE_PROVIDER_SITE_OTHER): Payer: BLUE CROSS/BLUE SHIELD | Admitting: Family Medicine

## 2018-06-17 VITALS — BP 120/80 | Temp 98.2°F | Ht 60.0 in | Wt 182.0 lb

## 2018-06-17 DIAGNOSIS — N6459 Other signs and symptoms in breast: Secondary | ICD-10-CM

## 2018-06-17 DIAGNOSIS — E039 Hypothyroidism, unspecified: Secondary | ICD-10-CM | POA: Diagnosis not present

## 2018-06-17 DIAGNOSIS — Z0000 Encounter for general adult medical examination without abnormal findings: Secondary | ICD-10-CM | POA: Diagnosis not present

## 2018-06-17 DIAGNOSIS — K5792 Diverticulitis of intestine, part unspecified, without perforation or abscess without bleeding: Secondary | ICD-10-CM

## 2018-06-17 MED ORDER — CIPROFLOXACIN HCL 500 MG PO TABS: 500.0000 mg | ORAL_TABLET | Freq: Two times a day (BID) | ORAL | 0 refills | Status: AC

## 2018-06-17 MED ORDER — ZOSTER VAC RECOMB ADJUVANTED 50 MCG/0.5ML IM SUSR: 0.5000 mL | Freq: Once | INTRAMUSCULAR | 1 refills | Status: AC

## 2018-06-17 MED ORDER — LEVOTHYROXINE SODIUM 125 MCG PO TABS: ORAL_TABLET | ORAL | 3 refills | Status: DC

## 2018-06-17 MED ORDER — METRONIDAZOLE 500 MG PO TABS: 500.0000 mg | ORAL_TABLET | Freq: Three times a day (TID) | ORAL | 0 refills | Status: AC

## 2018-06-17 NOTE — Progress Notes (Signed)
Subjective:    Patient ID: Sandra Zavala, female    DOB: 24-Jun-1955, 63 y.o.   MRN: [id number]  HPI  The patient comes in today for a wellness visit.  A review of their health history was completed. A review of medications was also completed.  Any needed refills; No  Eating habits: Good,low Carb  Falls/  MVA accidents in past few months: No  Regular exercise: Yes, Walks daily.  Specialist pt sees on regular basis: Dr.Fields for Ibs and Diverticulitis, thinks she has a flare coming on. She wanted to ask for antibiotics here today. Pain lower mid/left abd constipation and chills that started last night. States feels similar to previous flare ups of diverticulitis. Has seen GI for this previously. No vomiting, no blood in stool, no fever.   Preventative health issues were discussed. Mammogram UTD, Colonoscopy UTD, Pap smear UTD. Gets regular vision and dental exams.  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 Denies any other breast changes.  Sexually active, 1 partner, declines STD testing. No vaginal bleeding or discharge.  Additional concerns: None  Hypothyroidism: Doing well on current dose of levothyroxine. Compliant with medication. No adverse effects. No s/s of hyper/hypothyroid.   Review of Systems  Constitutional: Positive for chills. Negative for fatigue, fever and unexpected weight change.  HENT: Negative for congestion, ear pain, sinus pressure, sinus pain and sore throat.   Eyes: Negative for discharge and visual disturbance.  Respiratory: Negative for cough, shortness of breath and wheezing.   Cardiovascular: Negative for chest pain and leg swelling.  Gastrointestinal: Positive for abdominal pain and constipation. Negative for blood in stool, diarrhea, nausea and vomiting.  Genitourinary: Negative for difficulty urinating, hematuria, pelvic pain, vaginal  bleeding and vaginal discharge.  Skin: Negative for color change.  Neurological: Negative for dizziness, weakness, light-headedness and headaches.  Psychiatric/Behavioral: Negative for suicidal ideas.  All other systems reviewed and are negative.      Objective:   Physical Exam Vitals signs and nursing note reviewed. Exam conducted with a chaperone present.  Constitutional:      General: She is not in acute distress.    Appearance: She is well-developed. She is obese. She is not toxic-appearing.  HENT:     Head: Normocephalic and atraumatic.     Right Ear: Tympanic membrane normal.     Left Ear: Tympanic membrane normal.     Nose: Nose normal.     Mouth/Throat:     Mouth: Mucous membranes are moist.     Pharynx: Oropharynx is clear. Uvula midline.  Eyes:     General:        Right eye: No discharge.        Left eye: No discharge.     Conjunctiva/sclera: Conjunctivae normal.     Pupils: Pupils are equal, round, and reactive to light.  Neck:     Musculoskeletal: Neck supple. No neck rigidity.     Thyroid: No thyromegaly.  Cardiovascular:     Rate and Rhythm: Normal rate and regular rhythm.     Heart sounds: Normal heart sounds. No murmur.  Pulmonary:     Effort: Pulmonary effort is normal. No respiratory distress.     Breath sounds: Normal breath sounds. No wheezing.  Chest:     Breasts:        Right: No inverted nipple, mass, nipple discharge, skin change  or tenderness.        Left: Inverted nipple present. No mass, nipple discharge, skin change or tenderness.     Comments: Bilateral breast tissue with dense, fibrous tissue symmetrically. No tenderness or discrete masses palpated.  Abdominal:     General: Bowel sounds are decreased. There is no distension.     Palpations: Abdomen is soft. There is no mass.     Tenderness: There is abdominal tenderness in the suprapubic area and left lower quadrant. There is no guarding or rebound.  Genitourinary:    General: Normal vulva.      Vagina: Normal.     Cervix: Normal.     Uterus: Normal.      Adnexa: Right adnexa normal and left adnexa normal.     Comments: No obvious masses or tenderness on bimanual exam. Exam limited d/t abdominal girth. Musculoskeletal:        General: No deformity.  Lymphadenopathy:     Cervical: No cervical adenopathy.     Upper Body:     Right upper body: No supraclavicular or axillary adenopathy.     Left upper body: No supraclavicular or axillary adenopathy.  Skin:    General: Skin is warm and dry.  Neurological:     Mental Status: She is alert and oriented to person, place, and time.     Coordination: Coordination normal.    Results for orders placed or performed in visit on 05/15/18  Lipid panel  Result Value Ref Range   Cholesterol, Total 177 100 - 199 mg/dL   Triglycerides 69 0 - 149 mg/dL   HDL 59 >39 mg/dL   VLDL Cholesterol Cal 14 5 - 40 mg/dL   LDL Calculated 104 (H) 0 - 99 mg/dL   Chol/HDL Ratio 3.0 0.0 - 4.4 ratio  Basic metabolic panel  Result Value Ref Range   Glucose 90 65 - 99 mg/dL   BUN 17 8 - 27 mg/dL   Creatinine, Ser 0.65 0.57 - 1.00 mg/dL   GFR calc non Af Amer 96 >59 mL/min/1.73   GFR calc Af Amer 111 >59 mL/min/1.73   BUN/Creatinine Ratio 26 12 - 28   Sodium 141 134 - 144 mmol/L   Potassium 4.2 3.5 - 5.2 mmol/L   Chloride 104 96 - 106 mmol/L   CO2 21 20 - 29 mmol/L   Calcium 9.2 8.7 - 10.3 mg/dL  Hemoglobin A1c  Result Value Ref Range   Hgb A1c MFr Bld 5.4 4.8 - 5.6 %   Est. average glucose Bld gHb Est-mCnc 108 mg/dL  TSH  Result Value Ref Range   TSH 1.290 0.450 - 4.500 uIU/mL  Hepatic function panel  Result Value Ref Range   Total Protein 6.3 6.0 - 8.5 g/dL   Albumin 4.7 3.8 - 4.8 g/dL   Bilirubin Total 0.5 0.0 - 1.2 mg/dL   Bilirubin, Direct 0.12 0.00 - 0.40 mg/dL   Alkaline Phosphatase 113 39 - 117 IU/L   AST 21 0 - 40 IU/L   ALT 36 (H) 0 - 32 IU/L          Assessment & Plan:  1. Well adult exam Adult  wellness-complete.wellness physical was conducted today. Importance of diet and exercise were discussed in detail.  In addition to this a discussion regarding safety was also covered. We also reviewed over immunizations and gave recommendations regarding current immunization needed for age.   -Shingrix rx given In addition to this additional areas were also touched on including: Preventative health  exams needed:  Colonoscopy UTD Mammogram UTD Pap Smear UTD  Patient was advised yearly wellness exam. Recent lab work reviewed with patient.  2. 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 States about 6 months ago noticed left nipple inverted again, could be a benign finding, but recommend this be evaluated further with diagnostic mammogram and U/S. Will f/u based on results.  3. Acute diverticulitis Pt with hx of diverticulitis, typically resolves with outpatient abx treatment. Will go ahead and rx cipro and flagyl for patient. She understands if her symptoms become worse, develops fever or severe abdominal pain she needs to be seen right away either here or ED for blood work and scan. Otherwise recommend f/u in 5-7 days to assure resolution of symptoms.  4. Hypothyroidism, unspecified type Doing well on current medication regimen, will continue. Refills given. F/u in 1 year.   Dr. Sallee Lange was consulted on this case and is in agreement with the above treatment plan.

## 2018-06-18 ENCOUNTER — Encounter: Payer: BLUE CROSS/BLUE SHIELD | Admitting: Family Medicine

## 2018-06-30 ENCOUNTER — Encounter (HOSPITAL_COMMUNITY): Payer: BLUE CROSS/BLUE SHIELD

## 2018-07-07 ENCOUNTER — Ambulatory Visit (HOSPITAL_COMMUNITY)
Admission: RE | Admit: 2018-07-07 | Discharge: 2018-07-07 | Disposition: A | Discharge status: Home / Self Care | Payer: BLUE CROSS/BLUE SHIELD | Source: Ambulatory Visit | Attending: Family Medicine | Admitting: Family Medicine

## 2018-07-07 ENCOUNTER — Other Ambulatory Visit (HOSPITAL_COMMUNITY): Payer: BLUE CROSS/BLUE SHIELD

## 2018-07-07 DIAGNOSIS — N6459 Other signs and symptoms in breast: Secondary | ICD-10-CM

## 2018-07-07 DIAGNOSIS — R928 Other abnormal and inconclusive findings on diagnostic imaging of breast: Secondary | ICD-10-CM | POA: Diagnosis not present

## 2018-07-07 DIAGNOSIS — N6489 Other specified disorders of breast: Secondary | ICD-10-CM | POA: Diagnosis not present

## 2018-07-14 ENCOUNTER — Encounter: Payer: Self-pay | Admitting: Gastroenterology

## 2018-09-14 ENCOUNTER — Ambulatory Visit: Payer: BLUE CROSS/BLUE SHIELD | Admitting: Gastroenterology

## 2018-10-14 ENCOUNTER — Other Ambulatory Visit (HOSPITAL_COMMUNITY): Payer: Self-pay | Admitting: Family Medicine

## 2018-10-14 DIAGNOSIS — Z1231 Encounter for screening mammogram for malignant neoplasm of breast: Secondary | ICD-10-CM

## 2018-12-14 ENCOUNTER — Encounter (HOSPITAL_COMMUNITY): Payer: Self-pay

## 2018-12-14 ENCOUNTER — Ambulatory Visit (HOSPITAL_COMMUNITY)
Admission: RE | Admit: 2018-12-14 | Discharge: 2018-12-14 | Disposition: A | Discharge status: Home / Self Care | Payer: BC Managed Care – PPO | Source: Ambulatory Visit | Attending: Family Medicine | Admitting: Family Medicine

## 2018-12-14 ENCOUNTER — Other Ambulatory Visit: Payer: Self-pay

## 2018-12-14 DIAGNOSIS — Z1231 Encounter for screening mammogram for malignant neoplasm of breast: Secondary | ICD-10-CM | POA: Insufficient documentation

## 2019-02-08 IMAGING — MG DIGITAL SCREENING BILATERAL MAMMOGRAM WITH TOMO AND CAD
6 of 9 series · 6 of 25 positions shown · non-contrast
Comparison: Previous exam(s).

CLINICAL DATA: Screening.

EXAM:
DIGITAL SCREENING BILATERAL MAMMOGRAM WITH TOMO AND CAD

[L MLO (1 of 2)]
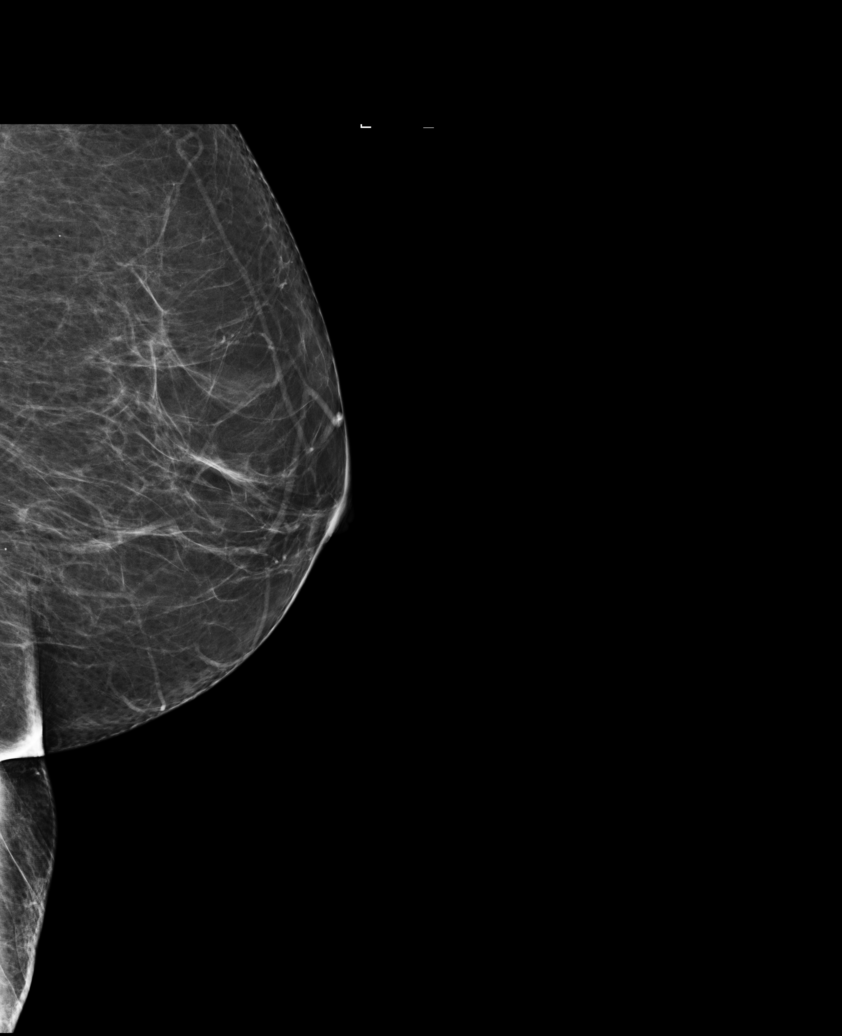

[R CC]
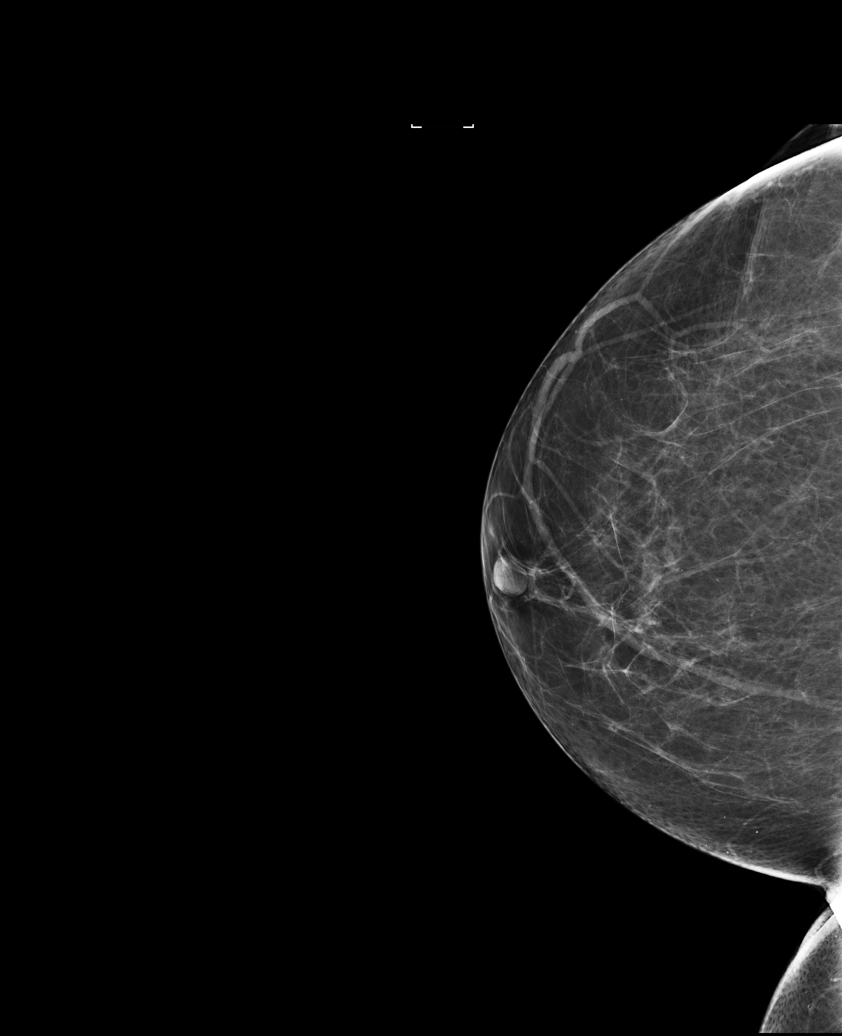

[R MLO]
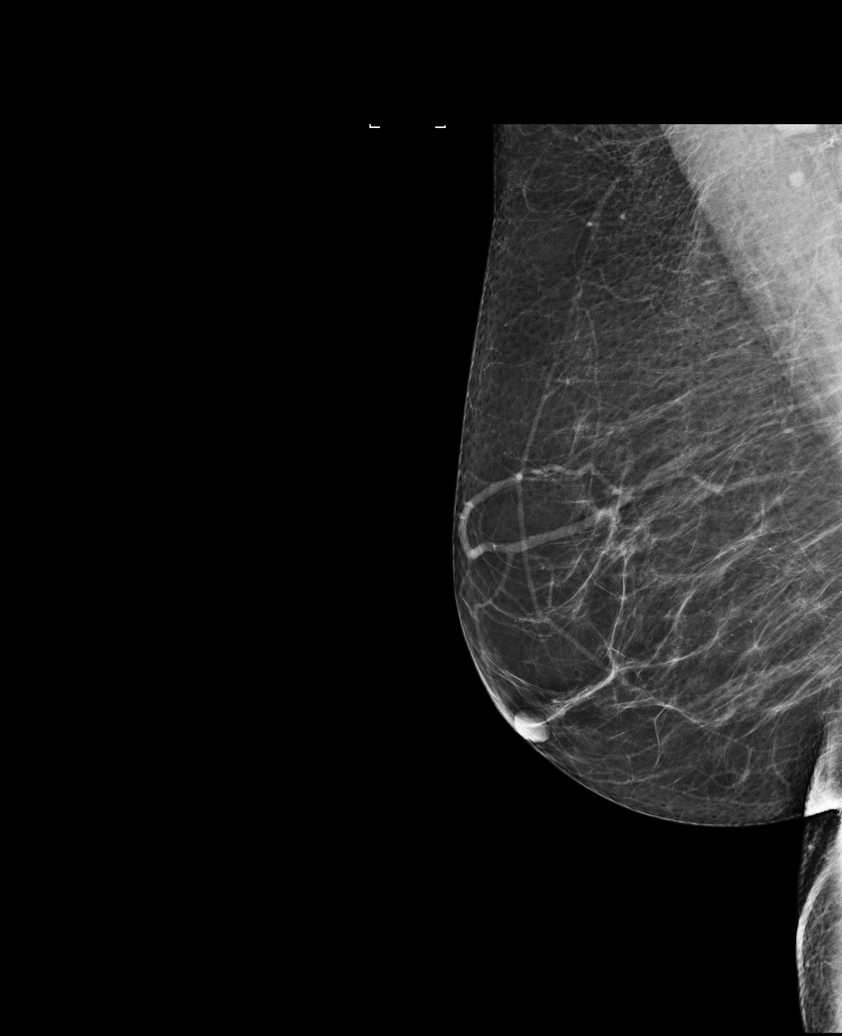

[L CC]
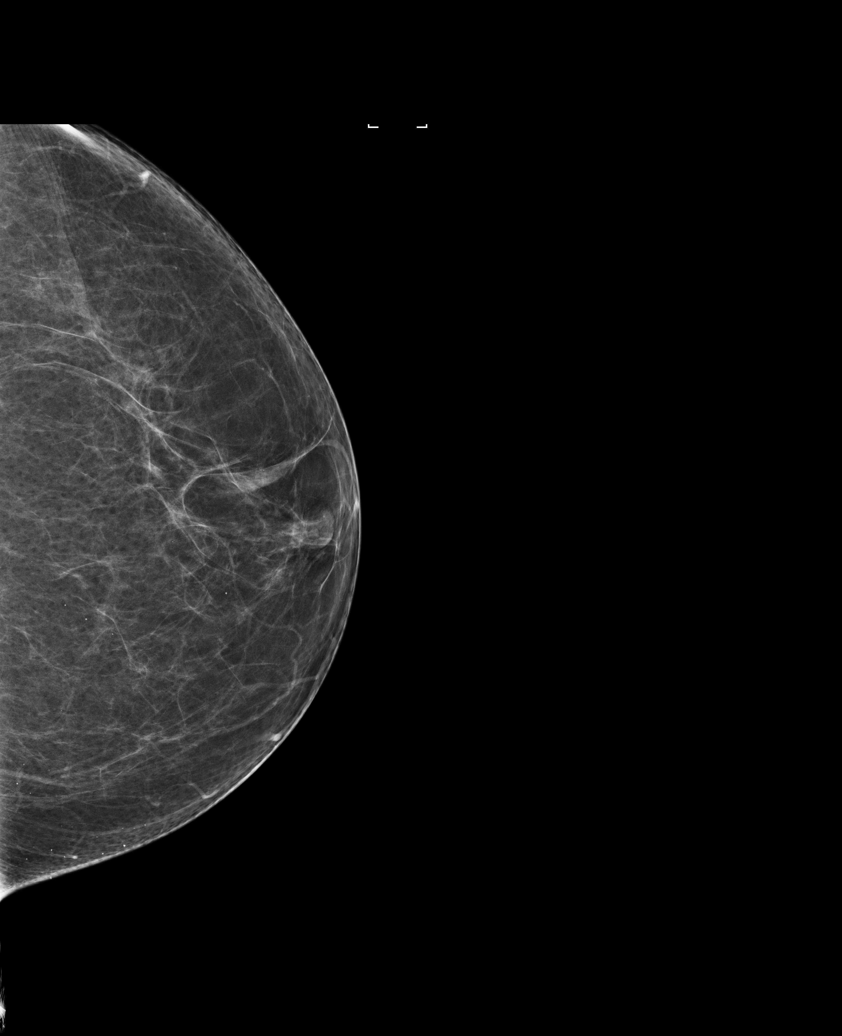

[L MLO (2 of 2)]
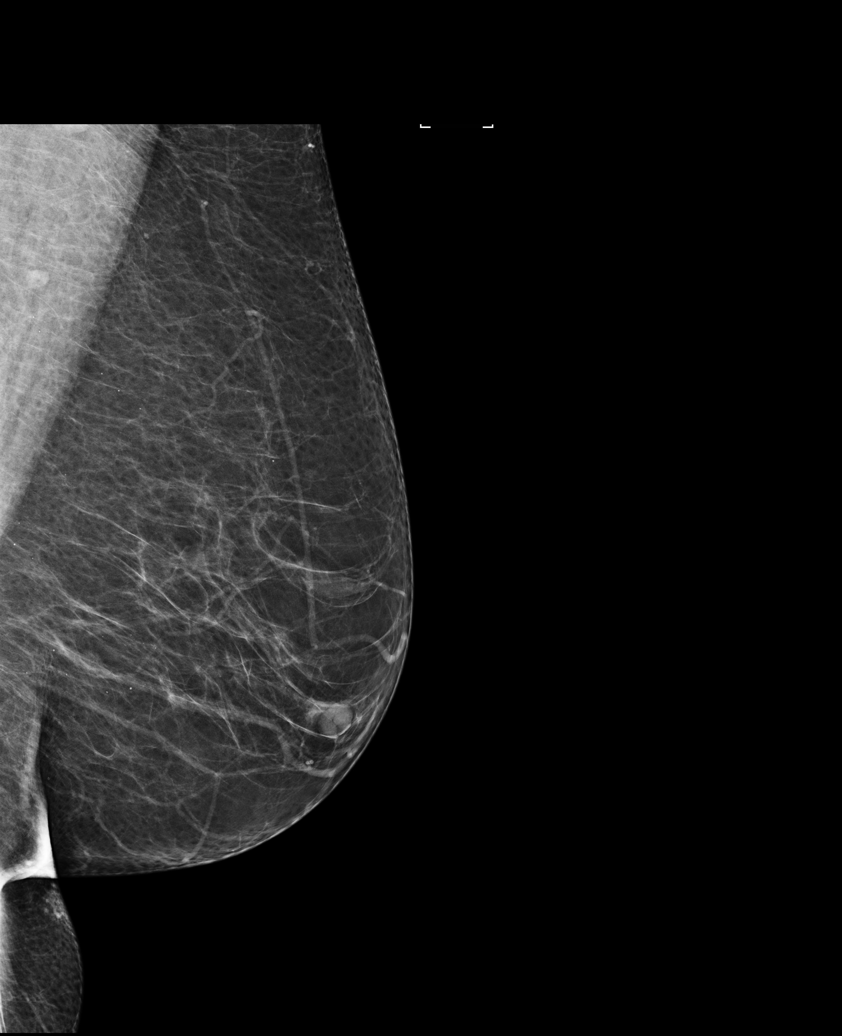

[L MLO tomo · tomo slice 43/86.0]
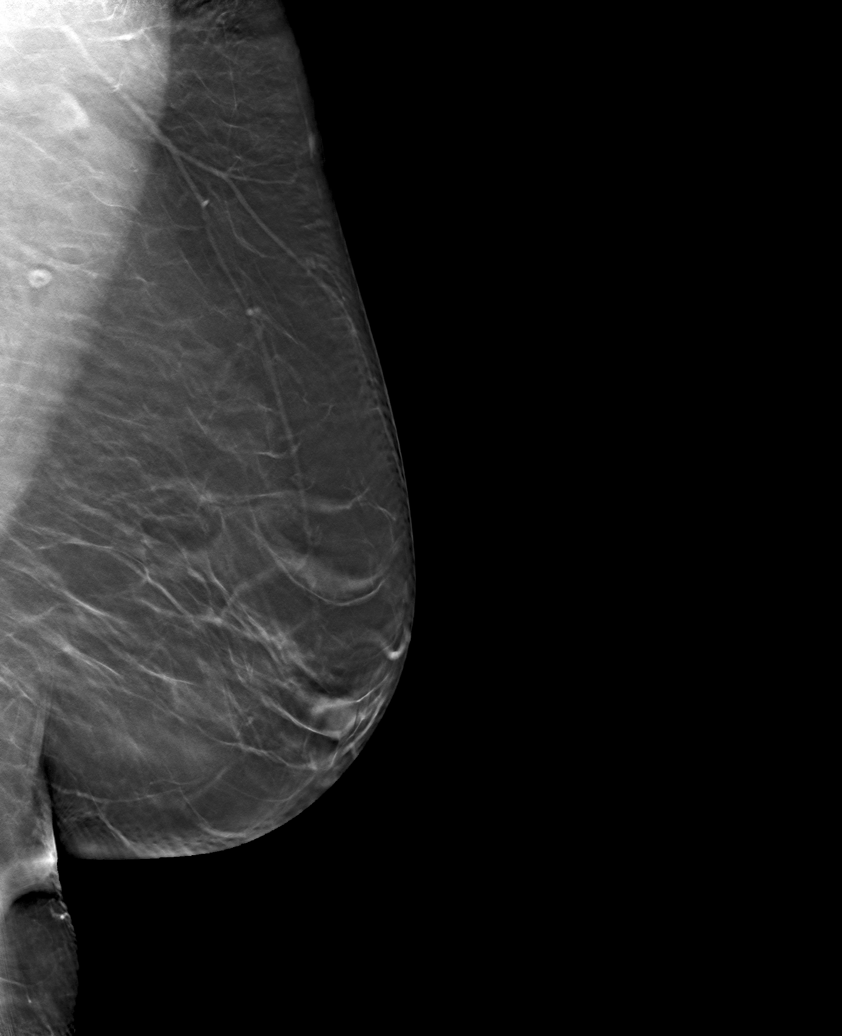

[6 of 25 positions shown; findings below may reference images not displayed]

ACR Breast Density Category b: There are scattered areas of
fibroglandular density.
FINDINGS: There are no findings suspicious for malignancy. Images were
processed with CAD.
IMPRESSION: No mammographic evidence of malignancy. A result letter of this
screening mammogram will be mailed directly to the patient.

RECOMMENDATION:
Screening mammogram in one year. (Code:[id number])

BI-RADS CATEGORY  1: Negative.

## 2019-02-23 DIAGNOSIS — Z23 Encounter for immunization: Secondary | ICD-10-CM | POA: Diagnosis not present

## 2019-02-25 ENCOUNTER — Other Ambulatory Visit: Payer: Self-pay

## 2019-02-25 ENCOUNTER — Ambulatory Visit
Admission: EM | Admit: 2019-02-25 | Discharge: 2019-02-25 | Disposition: A | Discharge status: Home / Self Care | Payer: BC Managed Care – PPO | Attending: Emergency Medicine | Admitting: Emergency Medicine

## 2019-02-25 DIAGNOSIS — L03113 Cellulitis of right upper limb: Secondary | ICD-10-CM | POA: Diagnosis not present

## 2019-02-25 DIAGNOSIS — T8029XA Infection following other infusion, transfusion and therapeutic injection, initial encounter: Secondary | ICD-10-CM

## 2019-02-25 MED ORDER — DOXYCYCLINE HYCLATE 100 MG PO CAPS: 100.0000 mg | ORAL_CAPSULE | Freq: Two times a day (BID) | ORAL | 0 refills | Status: DC

## 2019-02-25 NOTE — Discharge Instructions (Signed)
Wash with warm water and mild soap Apply cold compress Prescribed doxycycline take as directed and to completion Continue to alternate ibuprofen and tylenol as needed for pain and fever Follow up with PCP if symptoms persists Return or go to the ED if you have any new or worsening symptoms such as increased pain, redness, swelling, discharge, high fever, night sweats, abdominal pain, etc..[name]

## 2019-02-25 NOTE — ED Triage Notes (Signed)
Pt presents with area on upper arm that is red and swollen after receiving shingles vaccine

## 2019-02-25 NOTE — ED Provider Notes (Signed)
[facility]   [id number] 02/25/19 Arrival Time: 72  CC: "Cellulitis"  SUBJECTIVE:  Sandra Zavala is a 63 y.o. female who presents with possible skin infection to RT arm that began 2 days ago.  States symptoms began after receiving a shingles injection in the RT arm.  Localizes the symptoms to RT upper arm.  Describes it as painful, red, and warm to the touch.  Has tried ice, warm compresses, tylenol, and ibuprofen with minimal relief.  Symptoms are made worse to the touch.  Denies similar symptoms in the past.  Reports receiving a shingles injection in the past that did not cause this reaction.  Complains of associated swelling, now improved.  Denies fever, chills, nausea, vomiting, discharge, SOB, chest pain, abdominal pain, changes in bowel or bladder function.    ROS: As per HPI.  All other pertinent ROS negative.     Past Medical History:  Diagnosis Date  . Hypothyroid    Past Surgical History:  Procedure Laterality Date  . CHOLECYSTECTOMY  1999  . COLONOSCOPY N/A 06/26/2017   Procedure: COLONOSCOPY;  Surgeon: Danie Binder, MD;  Location: [facility];  Service: Endoscopy;  Laterality: N/A;  12:45pm  . TONSILECTOMY, ADENOIDECTOMY, BILATERAL MYRINGOTOMY AND TUBES  1962   No Known Allergies No current facility-administered medications on file prior to encounter.    Current Outpatient Medications on File Prior to Encounter  Medication Sig Dispense Refill  . calcium carbonate (OS-CAL) 600 MG TABS Take 600 mg by mouth 2 (two) times daily with a meal.     . Cholecalciferol (VITAMIN D3) 2000 UNITS TABS Take 2,000 Units by mouth 2 (two) times daily.     [name] FIBER ADULT GUMMIES PO Take by mouth. Twice a week    . ibuprofen (ADVIL,MOTRIN) 200 MG tablet Take 400-600 mg by mouth every 8 (eight) hours as needed for headache or mild pain.    [name] levothyroxine (SYNTHROID, LEVOTHROID) 125 MCG tablet Take 1 tablet by mouth  daily 90 tablet 3  . Multiple Vitamins-Minerals (ICAPS PO)  Take 1 tablet by mouth daily.     . Probiotic Product (PROBIOTIC PO) Take 1 tablet by mouth daily.     [name] triamcinolone (NASACORT) 55 MCG/ACT AERO nasal inhaler Place 2 sprays into the nose daily.    . vitamin C (ASCORBIC ACID) 500 MG tablet Take 500 mg by mouth daily.     Social History   Socioeconomic History  . Marital status: Married    Spouse name: Not on file  . Number of children: Not on file  . Years of education: Not on file  . Highest education level: Not on file  Occupational History  . Not on file  Social Needs  . Financial resource strain: Not on file  . Food insecurity    Worry: Not on file    Inability: Not on file  . Transportation needs    Medical: Not on file    Non-medical: Not on file  Tobacco Use  . Smoking status: Never Smoker  . Smokeless tobacco: Never Used  Substance and Sexual Activity  . Alcohol use: No  . Drug use: No  . Sexual activity: Yes  Lifestyle  . Physical activity    Days per week: Not on file    Minutes per session: Not on file  . Stress: Not on file  Relationships  . Social Herbalist on phone: Not on file    Gets together: Not on file  Attends religious service: Not on file    Active member of club or organization: Not on file    Attends meetings of clubs or organizations: Not on file    Relationship status: Not on file  . Intimate partner violence    Fear of current or ex partner: Not on file    Emotionally abused: Not on file    Physically abused: Not on file    Forced sexual activity: Not on file  Other Topics Concern  . Not on file  Social History Narrative  . Not on file   Family History  Problem Relation Age of Onset  . Osteoporosis Mother   . Heart disease Mother 38       stent  . Pancreatic cancer Father        unknown if primary pancreatic vs liver  . Liver cancer Father        unknown if primary pancreatic vs liver  . Colon cancer Neg Hx   . Gastric cancer Neg Hx   . Esophageal cancer Neg Hx      OBJECTIVE: Vitals:   02/25/19 1031  BP: (!) 148/93  Pulse: 90  Resp: 20  Temp: 98.9 F (37.2 C)  SpO2: 95%    General appearance: alert; no distress Head: NCAT Eye: EOMI grossly Neck: Supple; FROM Lungs: clear to auscultation bilaterally Heart: regular rate and rhythm.  Radial pulse 2+ bilaterally; cap refill <2 sec Extremities: no edema Skin: warm and dry; 4 x 5 cm area of erythema localized to RT upper lateral arm, mildly TTP, warm to the touch, no obvious abscess, drainage or bleeding Psychological: alert and cooperative; normal mood and affect  ASSESSMENT & PLAN:  1. Cellulitis of right upper extremity   2. Infection of injection site, initial encounter     Meds ordered this encounter  Medications  . doxycycline (VIBRAMYCIN) 100 MG capsule    Sig: Take 1 capsule (100 mg total) by mouth 2 (two) times daily.    Dispense:  20 capsule    Refill:  0    Order Specific Question:   Supervising Provider    Answer:   Raylene Everts [id number]   Wash with warm water and mild soap Apply cold compress Prescribed doxycycline take as directed and to completion Continue to alternate ibuprofen and tylenol as needed for pain and fever Follow up with PCP if symptoms persists Return or go to the ED if you have any new or worsening symptoms such as increased pain, redness, swelling, discharge, high fever, night sweats, abdominal pain, etc...   Reviewed expectations re: course of current medical issues. Questions answered. Outlined signs and symptoms indicating need for more acute intervention. Patient verbalized understanding. After Visit Summary given.   Lestine Box, PA-C 02/25/19 1045

## 2019-03-02 DIAGNOSIS — M1712 Unilateral primary osteoarthritis, left knee: Secondary | ICD-10-CM | POA: Diagnosis not present

## 2019-04-13 DIAGNOSIS — M1712 Unilateral primary osteoarthritis, left knee: Secondary | ICD-10-CM | POA: Diagnosis not present

## 2019-07-08 ENCOUNTER — Other Ambulatory Visit: Payer: Self-pay | Admitting: *Deleted

## 2019-07-08 MED ORDER — LEVOTHYROXINE SODIUM 125 MCG PO TABS: ORAL_TABLET | ORAL | 0 refills | Status: DC

## 2019-07-08 NOTE — Telephone Encounter (Signed)
One rx, needs follow up

## 2019-10-04 ENCOUNTER — Other Ambulatory Visit: Payer: Self-pay | Admitting: Family Medicine

## 2019-10-05 ENCOUNTER — Telehealth: Payer: Self-pay | Admitting: Family Medicine

## 2019-10-05 DIAGNOSIS — E039 Hypothyroidism, unspecified: Secondary | ICD-10-CM

## 2019-10-05 DIAGNOSIS — R739 Hyperglycemia, unspecified: Secondary | ICD-10-CM

## 2019-10-05 DIAGNOSIS — Z79899 Other long term (current) drug therapy: Secondary | ICD-10-CM

## 2019-10-05 DIAGNOSIS — Z13220 Encounter for screening for lipoid disorders: Secondary | ICD-10-CM

## 2019-10-05 NOTE — Telephone Encounter (Signed)
Please order the same labs as 06/10/18. Thanks.

## 2019-10-05 NOTE — Telephone Encounter (Signed)
Please advise what labs are needed

## 2019-10-05 NOTE — Telephone Encounter (Signed)
Pt has CPE scheduled with Hoyle Sauer on 6/9 and would like lab work ordered.

## 2019-10-05 NOTE — Telephone Encounter (Signed)
CPE scheduled 6/9

## 2019-10-05 NOTE — Telephone Encounter (Signed)
Blood work ordered in Epic. Patient notified. 

## 2019-10-06 DIAGNOSIS — Z13220 Encounter for screening for lipoid disorders: Secondary | ICD-10-CM | POA: Diagnosis not present

## 2019-10-06 DIAGNOSIS — E039 Hypothyroidism, unspecified: Secondary | ICD-10-CM | POA: Diagnosis not present

## 2019-10-06 DIAGNOSIS — R739 Hyperglycemia, unspecified: Secondary | ICD-10-CM | POA: Diagnosis not present

## 2019-10-06 DIAGNOSIS — Z79899 Other long term (current) drug therapy: Secondary | ICD-10-CM | POA: Diagnosis not present

## 2019-10-07 LAB — HEMOGLOBIN A1C
Est. average glucose Bld gHb Est-mCnc: 108 mg/dL
Hgb A1c MFr Bld: 5.4 % (ref 4.8–5.6)

## 2019-10-07 LAB — LIPID PANEL
Chol/HDL Ratio: 3.1 ratio (ref 0.0–4.4)
Cholesterol, Total: 181 mg/dL (ref 100–199)
HDL: 58 mg/dL (ref >39)
LDL Chol Calc (NIH): 108 mg/dL — ABNORMAL HIGH (ref 0–99)
Triglycerides: 79 mg/dL (ref 0–149)
VLDL Cholesterol Cal: 15 mg/dL (ref 5–40)

## 2019-10-07 LAB — BASIC METABOLIC PANEL
BUN/Creatinine Ratio: 23 (ref 12–28)
BUN: 15 mg/dL (ref 8–27)
CO2: 24 mmol/L (ref 20–29)
Calcium: 9.3 mg/dL (ref 8.7–10.3)
Chloride: 103 mmol/L (ref 96–106)
Creatinine, Ser: 0.65 mg/dL (ref 0.57–1.00)
GFR calc Af Amer: 110 mL/min/{1.73_m2} (ref >59)
GFR calc non Af Amer: 95 mL/min/{1.73_m2} (ref >59)
Glucose: 97 mg/dL (ref 65–99)
Potassium: 4.7 mmol/L (ref 3.5–5.2)
Sodium: 142 mmol/L (ref 134–144)

## 2019-10-07 LAB — TSH: TSH: 1.8 u[IU]/mL (ref 0.450–4.500)

## 2019-10-07 LAB — HEPATIC FUNCTION PANEL
ALT: 25 IU/L (ref 0–32)
AST: 19 IU/L (ref 0–40)
Albumin: 4.8 g/dL (ref 3.8–4.8)
Alkaline Phosphatase: 106 IU/L (ref 48–121)
Bilirubin Total: 0.4 mg/dL (ref 0.0–1.2)
Bilirubin, Direct: 0.13 mg/dL (ref 0.00–0.40)
Total Protein: 6.5 g/dL (ref 6.0–8.5)

## 2019-10-13 ENCOUNTER — Ambulatory Visit (INDEPENDENT_AMBULATORY_CARE_PROVIDER_SITE_OTHER): Payer: BC Managed Care – PPO | Admitting: Nurse Practitioner

## 2019-10-13 ENCOUNTER — Other Ambulatory Visit: Payer: Self-pay

## 2019-10-13 ENCOUNTER — Encounter: Payer: Self-pay | Admitting: Nurse Practitioner

## 2019-10-13 VITALS — BP 134/84 | Temp 97.3°F | Ht 58.5 in | Wt 178.6 lb

## 2019-10-13 DIAGNOSIS — Z01419 Encounter for gynecological examination (general) (routine) without abnormal findings: Secondary | ICD-10-CM

## 2019-10-13 DIAGNOSIS — M810 Age-related osteoporosis without current pathological fracture: Secondary | ICD-10-CM

## 2019-10-13 NOTE — Progress Notes (Signed)
Subjective:    Patient ID: Sandra Zavala, female    DOB: 11-01-1955, 64 y.o.   MRN: [id number]  HPI  Presents today for routine annual visit. Denies acute concerns. Vision and dental exams up to date. Appointment scheduled 2022 to have bilateral cataracts removed. Vaccinations up to date. [place] vaccination completed on 09/20/19.  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 Denies use of tobacco and alcohol use. States she is on a low carb diet and exercises 5 times a week walking at least 3 miles. Post menopausal without concerns of bleeding or discharge.   Review of Systems  Constitutional: Negative for appetite change, fatigue, fever and unexpected weight change.  Respiratory: Negative for cough, chest tightness, shortness of breath and wheezing.   Cardiovascular: Negative for chest pain, palpitations and leg swelling.  Gastrointestinal: Negative for abdominal pain, blood in stool, constipation, diarrhea, nausea and vomiting.  Genitourinary: Negative for difficulty urinating, dysuria, enuresis, frequency, genital sores, hematuria, pelvic pain, urgency, vaginal bleeding and vaginal discharge.   Depression screen [facility] 2/9 10/13/2019 06/17/2018 06/16/2017 06/12/2016 05/25/2016  Decreased Interest 0 0 0 0 0  Down, Depressed, Hopeless 0 0 0 0 0  PHQ - 2 Score 0 0 0 0 0          Objective:   Physical Exam Exam conducted with a chaperone present.  Constitutional:      Appearance: Normal appearance. She is well-groomed.  Neck:     Thyroid: No thyroid mass, thyromegaly or thyroid tenderness.  Cardiovascular:     Rate and Rhythm: Normal rate and regular rhythm.     Heart sounds: Normal heart sounds, S1 normal and S2 normal. No murmur.  Pulmonary:     Effort: Pulmonary effort is normal.     Breath sounds: Normal breath sounds.  Chest:     Breasts:        Right: No inverted nipple, mass, skin change or tenderness.        Left: Inverted nipple present. No  mass, skin change or tenderness.     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     Palpations: Abdomen is soft. There is no hepatomegaly, splenomegaly or mass.     Tenderness: There is no abdominal tenderness.  Genitourinary:    General: Normal vulva.     Pubic Area: No rash.      Labia:        Right: No rash, tenderness or lesion.        Left: No rash, tenderness or lesion.      Vagina: Normal. No vaginal discharge, erythema, tenderness or lesions.     Cervix: No cervical motion tenderness, discharge or erythema.     Comments: External GU: No rashes or lesions. Bimanual exam without tenderness or obvious masses. No vaginal discharge Lymphadenopathy:     Cervical: No cervical adenopathy.     Right cervical: No superficial, deep or posterior cervical adenopathy.    Left cervical: No superficial, deep or posterior cervical adenopathy.     Upper Body:     Right upper body: No supraclavicular, axillary or pectoral adenopathy.     Left upper body: No supraclavicular, axillary or pectoral adenopathy.  Neurological:     Mental Status: She is alert.  Psychiatric:        Mood and Affect: Mood normal.        Behavior: Behavior normal.  Thought Content: Thought content normal.        Judgment: Judgment normal.    Results for orders placed or performed in visit on 10/05/19  Lipid panel  Result Value Ref Range   Cholesterol, Total 181 100 - 199 mg/dL   Triglycerides 79 0 - 149 mg/dL   HDL 58 >39 mg/dL   VLDL Cholesterol Cal 15 5 - 40 mg/dL   LDL Chol Calc (NIH) 108 (H) 0 - 99 mg/dL   Chol/HDL Ratio 3.1 0.0 - 4.4 ratio  Hepatic function panel  Result Value Ref Range   Total Protein 6.5 6.0 - 8.5 g/dL   Albumin 4.8 3.8 - 4.8 g/dL   Bilirubin Total 0.4 0.0 - 1.2 mg/dL   Bilirubin, Direct 0.13 0.00 - 0.40 mg/dL   Alkaline Phosphatase 106 48 - 121 IU/L   AST 19 0 - 40 IU/L   ALT 25 0 - 32 IU/L  Basic metabolic panel  Result  Value Ref Range   Glucose 97 65 - 99 mg/dL   BUN 15 8 - 27 mg/dL   Creatinine, Ser 0.65 0.57 - 1.00 mg/dL   GFR calc non Af Amer 95 >59 mL/min/1.73   GFR calc Af Amer 110 >59 mL/min/1.73   BUN/Creatinine Ratio 23 12 - 28   Sodium 142 134 - 144 mmol/L   Potassium 4.7 3.5 - 5.2 mmol/L   Chloride 103 96 - 106 mmol/L   CO2 24 20 - 29 mmol/L   Calcium 9.3 8.7 - 10.3 mg/dL  TSH  Result Value Ref Range   TSH 1.800 0.450 - 4.500 uIU/mL  Hemoglobin A1c  Result Value Ref Range   Hgb A1c MFr Bld 5.4 4.8 - 5.6 %   Est. average glucose Bld gHb Est-mCnc 108 mg/dL   Reviewed labs with patient.       Assessment & Plan:    Well woman exam  Osteoporosis, unspecified osteoporosis type, unspecified pathological fracture presence - Plan: DG Bone Density   Complete Dexa Scan as scheduled. Continue with Vit D and calcium Continue with current diet and exercise plan Reviewed labs with patient Follow up in one year or earlier for concerns

## 2019-10-13 NOTE — Progress Notes (Signed)
   Subjective:    Patient ID: Sandra Zavala, female    DOB: 1956-03-05, 64 y.o.   MRN: [id number]  HPI The patient comes in today for a wellness visit.    A review of their health history was completed.  A review of medications was also completed.  Any needed refills; none at this time  Eating habits: eats pretty good  Falls/  MVA accidents in past few months: none  Regular exercise: walks 3 miles a day 5-6 times a week  Specialist pt sees on regular basis: Marylee Floras   Preventative health issues were discussed.   Additional concerns: bone density overdue?   Review of Systems     Objective:   Physical Exam        Assessment & Plan:

## 2019-10-21 ENCOUNTER — Other Ambulatory Visit (HOSPITAL_COMMUNITY): Payer: BC Managed Care – PPO

## 2019-11-01 ENCOUNTER — Other Ambulatory Visit: Payer: Self-pay

## 2019-11-01 ENCOUNTER — Other Ambulatory Visit (HOSPITAL_COMMUNITY): Payer: Self-pay | Admitting: Nurse Practitioner

## 2019-11-01 ENCOUNTER — Encounter: Payer: Self-pay | Admitting: Nurse Practitioner

## 2019-11-01 ENCOUNTER — Ambulatory Visit (HOSPITAL_COMMUNITY)
Admission: RE | Admit: 2019-11-01 | Discharge: 2019-11-01 | Disposition: A | Discharge status: Home / Self Care | Payer: BC Managed Care – PPO | Source: Ambulatory Visit | Attending: Nurse Practitioner | Admitting: Nurse Practitioner

## 2019-11-01 DIAGNOSIS — M810 Age-related osteoporosis without current pathological fracture: Secondary | ICD-10-CM | POA: Diagnosis not present

## 2019-11-01 DIAGNOSIS — M818 Other osteoporosis without current pathological fracture: Secondary | ICD-10-CM | POA: Diagnosis not present

## 2019-11-01 DIAGNOSIS — Z1231 Encounter for screening mammogram for malignant neoplasm of breast: Secondary | ICD-10-CM

## 2019-11-05 ENCOUNTER — Other Ambulatory Visit: Payer: Self-pay | Admitting: Nurse Practitioner

## 2019-11-05 MED ORDER — ALENDRONATE SODIUM 70 MG PO TABS: 70.0000 mg | ORAL_TABLET | ORAL | 3 refills | Status: DC

## 2019-12-16 ENCOUNTER — Other Ambulatory Visit: Payer: Self-pay

## 2019-12-16 ENCOUNTER — Ambulatory Visit (HOSPITAL_COMMUNITY)
Admission: RE | Admit: 2019-12-16 | Discharge: 2019-12-16 | Disposition: A | Discharge status: Home / Self Care | Payer: BC Managed Care – PPO | Source: Ambulatory Visit | Attending: Nurse Practitioner | Admitting: Nurse Practitioner

## 2019-12-16 DIAGNOSIS — Z1231 Encounter for screening mammogram for malignant neoplasm of breast: Secondary | ICD-10-CM | POA: Diagnosis not present

## 2019-12-23 ENCOUNTER — Telehealth: Payer: Self-pay | Admitting: Nurse Practitioner

## 2019-12-23 NOTE — Telephone Encounter (Signed)
Pt was trying to send mychart message to Sandra Zavala but it is currently down. She is having another diverticulitis episode. She is having pain, chills the usual stuff. She is taking tylenol extra strength, flagyl 500 mg and Cipro 500 mg since Monday. Pain is worse today and chills are back.   She states Hoyle Sauer told her to message her even if she is not in the office for issues like this.  [facility].

## 2019-12-23 NOTE — Telephone Encounter (Signed)
Given abdominal pain I would not recommend treating site unseen Does she have enough antibiotics to utilize from today into tomorrow? I would recommend an appointment with Santiago Glad tomorrow morning at 11 If she needs refills on the medicine send in I can do so contingent on coming tomorrow morning to be seen  Nurses please let me know if refills are needed and where to send to

## 2019-12-23 NOTE — Telephone Encounter (Signed)
Pt states mid abdominal pain started on Monday. Has left over flagyl and cipro and started taking that. States the pain is the same as when she has a flare up. Last flare up feb 2020. States meds have always helped but not this time. Having chills. States no fever and pain is not severe. States carolyn told her to just send her a message when she has a flare up. Advised her that carolyn is not in today and she should be seen since meds have not helped this time and she states she is out of town and will be back late this evening.

## 2019-12-23 NOTE — Telephone Encounter (Signed)
Patient notified of Dr. Bary Leriche recommendation. Patient has enough medication and agreed to appointment tomorrow at 11. Note was left for front to schedule. Please make sure she gets on karen's schedule.

## 2019-12-23 NOTE — Telephone Encounter (Signed)
Has appt for tomorrow with karen at 11:20

## 2019-12-24 ENCOUNTER — Encounter: Payer: Self-pay | Admitting: Family Medicine

## 2019-12-24 ENCOUNTER — Other Ambulatory Visit: Payer: Self-pay

## 2019-12-24 ENCOUNTER — Ambulatory Visit (INDEPENDENT_AMBULATORY_CARE_PROVIDER_SITE_OTHER): Payer: BC Managed Care – PPO | Admitting: Family Medicine

## 2019-12-24 VITALS — BP 130/82 | HR 82 | Temp 98.8°F | Wt 178.0 lb

## 2019-12-24 DIAGNOSIS — K5792 Diverticulitis of intestine, part unspecified, without perforation or abscess without bleeding: Secondary | ICD-10-CM

## 2019-12-24 DIAGNOSIS — R824 Acetonuria: Secondary | ICD-10-CM | POA: Diagnosis not present

## 2019-12-24 DIAGNOSIS — R109 Unspecified abdominal pain: Secondary | ICD-10-CM

## 2019-12-24 LAB — GLUCOSE, POCT (MANUAL RESULT ENTRY): POC Glucose: 96 mg/dl (ref 70–99)

## 2019-12-24 LAB — POCT URINALYSIS DIPSTICK (MANUAL)
Nitrite, UA: NEGATIVE
Poct Blood: NEGATIVE
Poct Glucose: NORMAL mg/dL
Poct Protein: 30 mg/dL — AB
Spec Grav, UA: 1.015 (ref 1.010–1.025)
pH, UA: 5 (ref 5.0–8.0)

## 2019-12-24 MED ORDER — AMOXICILLIN-POT CLAVULANATE 875-125 MG PO TABS: 1.0000 | ORAL_TABLET | Freq: Two times a day (BID) | ORAL | 0 refills | Status: DC

## 2019-12-24 NOTE — Progress Notes (Signed)
Patient ID: NUR RABOLD, female    DOB: [birth year], 64 y.o.   MRN: [id number]   Chief Complaint  Patient presents with  . Diverticulitis    Since monday patient has had abdominal pain, nausea and chills. Has been taking Cipro and flagyl. Feeling better today. Able to eat and keep food down but has no appetite.    Subjective:    HPI Ms. Sandra Zavala presents today with a long history of diverticulitis.  Her symptoms started on Monday and she had leftover Cipro and Flagyl at home so she started this medication.  She presents today because her symptoms have not resolved after 4 days on Cipro and Flagyl.  She denies fever but reports chills.  Her pain today is 3/10.  Medical History Sandra Zavala has a past medical history of Hypothyroid.   Outpatient Encounter Medications as of 12/24/2019  Medication Sig  . alendronate (FOSAMAX) 70 MG tablet Take 1 tablet (70 mg total) by mouth every 7 (seven) days. Take with a full glass of water on an empty stomach.  [name] amoxicillin-clavulanate (AUGMENTIN) 875-125 MG tablet Take 1 tablet by mouth 2 (two) times daily.  . calcium carbonate (OS-CAL) 600 MG TABS Take 600 mg by mouth 2 (two) times daily with a meal.   . Cholecalciferol (VITAMIN D3) 2000 UNITS TABS Take 2,000 Units by mouth 2 (two) times daily.   [name] ibuprofen (ADVIL,MOTRIN) 200 MG tablet Take 400-600 mg by mouth every 8 (eight) hours as needed for headache or mild pain.  [name] levothyroxine (SYNTHROID) 125 MCG tablet TAKE 1 TABLET DAILY (NEED FOLLOW UP VISIT)  . triamcinolone (NASACORT) 55 MCG/ACT AERO nasal inhaler Place 2 sprays into the nose daily.  . vitamin C (ASCORBIC ACID) 500 MG tablet Take 500 mg by mouth daily.   No facility-administered encounter medications on file as of 12/24/2019.     Review of Systems  Constitutional: Positive for chills. Negative for fever.  HENT: Negative.   Respiratory: Negative.   Gastrointestinal: Positive for abdominal pain and nausea. Negative for blood in stool and  vomiting.  Genitourinary: Negative.      Vitals BP 130/82   Pulse 82   Temp 98.8 F (37.1 C)   Wt 178 lb (80.7 kg)   SpO2 97%   BMI 36.57 kg/m   Objective:   Physical Exam Constitutional:      Appearance: Normal appearance.  Cardiovascular:     Rate and Rhythm: Normal rate and regular rhythm.     Pulses: Normal pulses.     Heart sounds: Normal heart sounds.  Pulmonary:     Effort: Pulmonary effort is normal.     Breath sounds: Normal breath sounds.  Abdominal:     Palpations: Abdomen is soft.     Tenderness: There is no abdominal tenderness. There is no guarding or rebound.  Neurological:     Mental Status: She is alert.      Assessment and Plan   1. Diverticulitis - amoxicillin-clavulanate (AUGMENTIN) 875-125 MG tablet; Take 1 tablet by mouth 2 (two) times daily.  Dispense: 20 tablet; Refill: 0 - POCT Urinalysis Dip Manual - Urine Culture  2. Urine ketone - POCT Glucose (CBG)  3. Abdominal pain, unspecified abdominal location - amoxicillin-clavulanate (AUGMENTIN) 875-125 MG tablet; Take 1 tablet by mouth 2 (two) times daily.  Dispense: 20 tablet; Refill: 0 - Urine Culture    After 4 days of antibiotic treatment Ms. Sandra Zavala presents to the office to be assessed. She still reports abdominal pain  3 out of 10 today however this is much better than it has been. Her abdominal exam is relatively negative, therefore, it is appropriate to continue outpatient treatment for her diverticulitis.  Her pain is mostly with walking and jarring. She will start Augmentin by mouth today and follow-up on Monday to ensure resolution of her symptoms.  Incidentally her urine is positive for ketones, leukocytes, and protein- she reports that she is on a keto type diet and eats very little carbohydrate.  We did a point-of-care glucose and it was 96.  We will send her urine for culture.  Agrees with plan of care discussed today. Understands warning signs to seek further care: Fever,  chills, rigors- she understands to go the ER immediately if the symptoms develop this weekend.  We discussed risk of perforation/sepsis and she understands how important it is to not ignore any symptoms.  She assures me that her symptoms have improved since Monday.   [name], NP

## 2019-12-24 NOTE — Patient Instructions (Addendum)
Start Augmentin, stop Cipro and Flagyl  You need to be seen again on Monday- we need complete resolution of your symptoms!!  Take your temperature if you feel chills again.  Diverticulitis  Diverticulitis is infection or inflammation of small pouches (diverticula) in the colon that form due to a condition called diverticulosis. Diverticula can trap stool (feces) and bacteria, causing infection and inflammation. Diverticulitis may cause severe stomach pain and diarrhea. It may lead to tissue damage in the colon that causes bleeding. The diverticula may also burst (rupture) and cause infected stool to enter other areas of the abdomen. Complications of diverticulitis can include:  Bleeding.  Severe infection.  Severe pain.  Rupture (perforation) of the colon.  Blockage (obstruction) of the colon. What are the causes? This condition is caused by stool becoming trapped in the diverticula, which allows bacteria to grow in the diverticula. This leads to inflammation and infection. What increases the risk? You are more likely to develop this condition if:  You have diverticulosis. The risk for diverticulosis increases if: ? You are overweight or obese. ? You use tobacco products. ? You do not get enough exercise.  You eat a diet that does not include enough fiber. High-fiber foods include fruits, vegetables, beans, nuts, and whole grains. What are the signs or symptoms? Symptoms of this condition may include:  Pain and tenderness in the abdomen. The pain is normally located on the left side of the abdomen, but it may occur in other areas.  Fever and chills.  Bloating.  Cramping.  Nausea.  Vomiting.  Changes in bowel routines.  Blood in your stool. How is this diagnosed? This condition is diagnosed based on:  Your medical history.  A physical exam.  Tests to make sure there is nothing else causing your condition. These tests may include: ? Blood tests. ? Urine  tests. ? Imaging tests of the abdomen, including X-rays, ultrasounds, MRIs, or CT scans. How is this treated? Most cases of this condition are mild and can be treated at home. Treatment may include:  Taking over-the-counter pain medicines.  Following a clear liquid diet.  Taking antibiotic medicines by mouth.  Rest. More severe cases may need to be treated at a hospital. Treatment may include:  Not eating or drinking.  Taking prescription pain medicine.  Receiving antibiotic medicines through an IV tube.  Receiving fluids and nutrition through an IV tube.  Surgery. When your condition is under control, your health care provider may recommend that you have a colonoscopy. This is an exam to look at the entire large intestine. During the exam, a lubricated, bendable tube is inserted into the anus and then passed into the rectum, colon, and other parts of the large intestine. A colonoscopy can show how severe your diverticula are and whether something else may be causing your symptoms. Follow these instructions at home: Medicines  Take over-the-counter and prescription medicines only as told by your health care provider. These include fiber supplements, probiotics, and stool softeners.  If you were prescribed an antibiotic medicine, take it as told by your health care provider. Do not stop taking the antibiotic even if you start to feel better.  Do not drive or use heavy machinery while taking prescription pain medicine. General instructions   Follow a full liquid diet or another diet as directed by your health care provider. After your symptoms improve, your health care provider may tell you to change your diet. He or she may recommend that you eat a  diet that contains at least 25 g (25 grams) of fiber daily. Fiber makes it easier to pass stool. Healthy sources of fiber include: ? Berries. One cup contains 4-8 grams of fiber. ? Beans or lentils. One half cup contains 5-8 grams of  fiber. ? Green vegetables. One cup contains 4 grams of fiber.  Exercise for at least 30 minutes, 3 times each week. You should exercise hard enough to raise your heart rate and break a sweat.  Keep all follow-up visits as told by your health care provider. This is important. You may need a colonoscopy. Contact a health care provider if:  Your pain does not improve.  You have a hard time drinking or eating food.  Your bowel movements do not return to normal. Get help right away if:  Your pain gets worse.  Your symptoms do not get better with treatment.  Your symptoms suddenly get worse.  You have a fever.  You vomit more than one time.  You have stools that are bloody, black, or tarry. Summary  Diverticulitis is infection or inflammation of small pouches (diverticula) in the colon that form due to a condition called diverticulosis. Diverticula can trap stool (feces) and bacteria, causing infection and inflammation.  You are at higher risk for this condition if you have diverticulosis and you eat a diet that does not include enough fiber.  Most cases of this condition are mild and can be treated at home. More severe cases may need to be treated at a hospital.  When your condition is under control, your health care provider may recommend that you have an exam called a colonoscopy. This exam can show how severe your diverticula are and whether something else may be causing your symptoms. This information is not intended to replace advice given to you by your health care provider. Make sure you discuss any questions you have with your health care provider. Document Revised: 04/04/2017 Document Reviewed: 05/25/2016 Elsevier Patient Education  2020 [employer].

## 2019-12-26 ENCOUNTER — Encounter: Payer: Self-pay | Admitting: Family Medicine

## 2019-12-26 LAB — SPECIMEN STATUS REPORT

## 2019-12-26 LAB — URINE CULTURE

## 2019-12-27 ENCOUNTER — Ambulatory Visit: Payer: BC Managed Care – PPO | Admitting: Family Medicine

## 2019-12-27 ENCOUNTER — Other Ambulatory Visit: Payer: Self-pay

## 2019-12-27 ENCOUNTER — Encounter: Payer: Self-pay | Admitting: Family Medicine

## 2019-12-27 VITALS — BP 134/74 | HR 76 | Temp 97.4°F | Wt 178.8 lb

## 2019-12-27 DIAGNOSIS — K5792 Diverticulitis of intestine, part unspecified, without perforation or abscess without bleeding: Secondary | ICD-10-CM | POA: Diagnosis not present

## 2019-12-27 NOTE — Progress Notes (Signed)
   Patient ID: Sandra Zavala, female    DOB: 08/29/1955, 64 y.o.   MRN: [id number]   Chief Complaint  Patient presents with  . Follow-up    Patient has improved significantly since last week.    Subjective:    HPI   Medical History Sandra Zavala has a past medical history of Hypothyroid.   Outpatient Encounter Medications as of 12/27/2019  Medication Sig  . alendronate (FOSAMAX) 70 MG tablet Take 1 tablet (70 mg total) by mouth every 7 (seven) days. Take with a full glass of water on an empty stomach.  [name] amoxicillin-clavulanate (AUGMENTIN) 875-125 MG tablet Take 1 tablet by mouth 2 (two) times daily.  . calcium carbonate (OS-CAL) 600 MG TABS Take 600 mg by mouth 2 (two) times daily with a meal.   . Cholecalciferol (VITAMIN D3) 2000 UNITS TABS Take 2,000 Units by mouth 2 (two) times daily.   [name] ibuprofen (ADVIL,MOTRIN) 200 MG tablet Take 400-600 mg by mouth every 8 (eight) hours as needed for headache or mild pain.  [name] levothyroxine (SYNTHROID) 125 MCG tablet TAKE 1 TABLET DAILY (NEED FOLLOW UP VISIT)  . triamcinolone (NASACORT) 55 MCG/ACT AERO nasal inhaler Place 2 sprays into the nose daily.  . vitamin C (ASCORBIC ACID) 500 MG tablet Take 500 mg by mouth daily.   No facility-administered encounter medications on file as of 12/27/2019.     Review of Systems  Constitutional: Negative for chills and fever.  Gastrointestinal: Negative for abdominal distention, abdominal pain and diarrhea.     Vitals BP 134/74   Pulse 76   Temp (!) 97.4 F (36.3 C)   Wt 178 lb 12.8 oz (81.1 kg)   SpO2 97%   BMI 36.73 kg/m   Objective:   Physical Exam Constitutional:      Appearance: Normal appearance.  Cardiovascular:     Rate and Rhythm: Normal rate and regular rhythm.     Heart sounds: Normal heart sounds.  Pulmonary:     Effort: Pulmonary effort is normal.     Breath sounds: Normal breath sounds.  Abdominal:     General: Bowel sounds are normal. There is no distension.     Palpations:  Abdomen is soft.     Tenderness: There is no abdominal tenderness. There is no guarding or rebound.  Neurological:     Mental Status: She is alert.      Assessment and Plan   1. Diverticulitis   Sandra Zavala is here for follow-up from Friday. She is 99% back to normal. Reports pain is only with coughing or sneezing. Her abdominal exam is negative. No pain or tenderness in any quadrant. I feel confident that she will have complete resolution of her symptoms in next day or so. Continue Augmentin as instructed. Tolerating Augmentin well. She resumed her usual walking routine yesterday without problems. Feels good. Looks good.   Agrees with plan of care discussed today. Understands warning signs to seek further care: any change in symptoms. She understands the risk of perforation/sepsis and how serious that could be. Understands to follow-up if symptoms do not improve or if anything changes.   [name], NP

## 2020-01-03 ENCOUNTER — Other Ambulatory Visit: Payer: Self-pay | Admitting: Family Medicine

## 2020-01-18 DIAGNOSIS — M2042 Other hammer toe(s) (acquired), left foot: Secondary | ICD-10-CM | POA: Diagnosis not present

## 2020-01-18 DIAGNOSIS — M79672 Pain in left foot: Secondary | ICD-10-CM | POA: Diagnosis not present

## 2020-01-18 DIAGNOSIS — M25872 Other specified joint disorders, left ankle and foot: Secondary | ICD-10-CM | POA: Diagnosis not present

## 2020-01-28 ENCOUNTER — Encounter: Payer: Self-pay | Admitting: Nurse Practitioner

## 2020-01-28 MED ORDER — ALENDRONATE SODIUM 70 MG PO TABS: 70.0000 mg | ORAL_TABLET | ORAL | 0 refills | Status: DC

## 2020-02-29 DIAGNOSIS — M2042 Other hammer toe(s) (acquired), left foot: Secondary | ICD-10-CM | POA: Diagnosis not present

## 2020-02-29 DIAGNOSIS — M79672 Pain in left foot: Secondary | ICD-10-CM | POA: Diagnosis not present

## 2020-02-29 DIAGNOSIS — M25872 Other specified joint disorders, left ankle and foot: Secondary | ICD-10-CM | POA: Diagnosis not present

## 2020-04-03 ENCOUNTER — Other Ambulatory Visit: Payer: Self-pay | Admitting: Family Medicine

## 2020-04-24 ENCOUNTER — Encounter: Payer: Self-pay | Admitting: Family Medicine

## 2020-04-24 MED ORDER — ALENDRONATE SODIUM 70 MG PO TABS: 70.0000 mg | ORAL_TABLET | ORAL | 0 refills | Status: DC

## 2020-04-24 NOTE — Telephone Encounter (Signed)
May have 3 month with 2 rf

## 2020-04-24 NOTE — Telephone Encounter (Signed)
Prescription sent electronically to pharmacy. 

## 2020-04-24 NOTE — Addendum Note (Signed)
Addended by: Dairl Ponder on: 04/24/2020 05:03 PM   Modules accepted: Orders

## 2020-05-19 DIAGNOSIS — Z23 Encounter for immunization: Secondary | ICD-10-CM | POA: Diagnosis not present

## 2020-07-03 ENCOUNTER — Other Ambulatory Visit: Payer: Self-pay | Admitting: Family Medicine

## 2020-07-03 NOTE — Telephone Encounter (Signed)
Please contact patient and have her set up appt. Then may send back to nurses. Thank you

## 2020-07-04 NOTE — Telephone Encounter (Signed)
Med Check appt made with Hoyle Sauer on Friday the 11th

## 2020-07-04 NOTE — Telephone Encounter (Signed)
Sent mychart message

## 2020-07-14 ENCOUNTER — Encounter: Payer: Self-pay | Admitting: Nurse Practitioner

## 2020-07-14 ENCOUNTER — Other Ambulatory Visit: Payer: Self-pay

## 2020-07-14 ENCOUNTER — Ambulatory Visit: Payer: BC Managed Care – PPO | Admitting: Nurse Practitioner

## 2020-07-14 VITALS — BP 128/80 | HR 86 | Temp 96.0°F | Wt 175.0 lb

## 2020-07-14 DIAGNOSIS — Z79899 Other long term (current) drug therapy: Secondary | ICD-10-CM

## 2020-07-14 DIAGNOSIS — Z13220 Encounter for screening for lipoid disorders: Secondary | ICD-10-CM | POA: Diagnosis not present

## 2020-07-14 DIAGNOSIS — E039 Hypothyroidism, unspecified: Secondary | ICD-10-CM

## 2020-07-14 DIAGNOSIS — R739 Hyperglycemia, unspecified: Secondary | ICD-10-CM

## 2020-07-14 MED ORDER — ALENDRONATE SODIUM 70 MG PO TABS: 70.0000 mg | ORAL_TABLET | ORAL | 0 refills | Status: DC

## 2020-07-14 NOTE — Progress Notes (Signed)
° °  Subjective:    Patient ID: Sandra Zavala, female    DOB: 03-10-1956, 65 y.o.   MRN: [id number]  HPI Pt here for follow up on thyroid medication. Pt is taking Levothyroxine 125 mg daily. Pt would like refills on Fosamax and Levothyroxine. Pt did call in and get 90 day supply of thyroid med.  Denies any issues with her current dose of levothyroxine.  Is due for yearly lab work. Also patient needs refills on her Fosamax.  Taking this as directed.  Denies any side effects. Denies chest pain shortness of breath or edema.  Review of Systems     Objective:   Physical Exam NAD.  Alert, oriented.  Thyroid nontender to palpation, no mass or goiter noted. lungs clear.  Heart regular rate rhythm. Today's Vitals   07/14/20 1423  BP: 128/80  Pulse: 86  Temp: (!) 96 F (35.6 C)  SpO2: 96%  Weight: 175 lb (79.4 kg)   Body mass index is 35.95 kg/m.        Assessment & Plan:   Problem List Items Addressed This Visit      Endocrine   Hypothyroidism - Primary   Relevant Orders   CBC with Differential   Comprehensive Metabolic Panel (CMET)   Lipid Profile   TSH   Hemoglobin A1c    Other Visit Diagnoses    High risk medication use       Relevant Orders   CBC with Differential   Comprehensive Metabolic Panel (CMET)   Lipid Profile   TSH   Hemoglobin A1c   Screening, lipid       Relevant Orders   CBC with Differential   Comprehensive Metabolic Panel (CMET)   Lipid Profile   TSH   Hemoglobin A1c   Hyperglycemia       Relevant Orders   CBC with Differential   Comprehensive Metabolic Panel (CMET)   Lipid Profile   TSH   Hemoglobin A1c     Continue current medications as directed.  Routine yearly lab pending along with physical in June. Return for physical in June.

## 2020-09-08 ENCOUNTER — Encounter: Payer: Self-pay | Admitting: Family Medicine

## 2020-09-08 ENCOUNTER — Ambulatory Visit: Payer: BC Managed Care – PPO | Admitting: Family Medicine

## 2020-09-08 ENCOUNTER — Other Ambulatory Visit: Payer: Self-pay

## 2020-09-08 ENCOUNTER — Encounter: Payer: Self-pay | Admitting: Nurse Practitioner

## 2020-09-08 VITALS — BP 126/80 | HR 78 | Temp 98.1°F | Ht 58.5 in | Wt 171.0 lb

## 2020-09-08 DIAGNOSIS — K5792 Diverticulitis of intestine, part unspecified, without perforation or abscess without bleeding: Secondary | ICD-10-CM

## 2020-09-08 MED ORDER — METRONIDAZOLE 500 MG PO TABS: 500.0000 mg | ORAL_TABLET | Freq: Three times a day (TID) | ORAL | 0 refills | Status: AC

## 2020-09-08 MED ORDER — CIPROFLOXACIN HCL 500 MG PO TABS: 500.0000 mg | ORAL_TABLET | Freq: Two times a day (BID) | ORAL | 0 refills | Status: AC

## 2020-09-08 NOTE — Telephone Encounter (Signed)
Left message to return call. I put pt on schedule today for 1:30 with Santiago Glad

## 2020-09-08 NOTE — Patient Instructions (Signed)
Diverticulitis  Diverticulitis is when small pouches in your colon (large intestine) get infected or swollen. This causes pain in the belly (abdomen) and watery poop (diarrhea). These pouches are called diverticula. The pouches form in people who have a condition called diverticulosis. What are the causes? This condition may be caused by poop (stool) that gets trapped in the pouches in your colon. The poop lets germs (bacteria) grow in the pouches. This causes the infection. What increases the risk? You are more likely to get this condition if you have small pouches in your colon. The risk is higher if:  You are overweight or very overweight (obese).  You do not exercise enough.  You drink alcohol.  You smoke or use products with tobacco in them.  You eat a diet that has a lot of red meat such as beef, pork, or lamb.  You eat a diet that does not have enough fiber in it.  You are older than 64 years of age. What are the signs or symptoms?  Pain in the belly. Pain is often on the left side, but it may be in other areas.  Fever and feeling cold.  Feeling like you may vomit.  Vomiting.  Having cramps.  Feeling full.  Changes to how often you poop.  Blood in your poop. How is this treated? Most cases are treated at home by:  Taking over-the-counter pain medicines.  Following a clear liquid diet.  Taking antibiotic medicines.  Resting. Very bad cases may need to be treated at a hospital. This may include:  Not eating or drinking.  Taking prescription pain medicine.  Getting antibiotic medicines through an IV tube.  Getting fluid and food through an IV tube.  Having surgery. When you are feeling better, your doctor may tell you to have a test to check your colon (colonoscopy). Follow these instructions at home: Medicines  Take over-the-counter and prescription medicines only as told by your doctor. These include: ? Antibiotics. ? Pain medicines. ? Fiber  pills. ? Probiotics. ? Stool softeners.  If you were prescribed an antibiotic medicine, take it as told by your doctor. Do not stop taking the antibiotic even if you start to feel better.  Ask your doctor if the medicine prescribed to you requires you to avoid driving or using machinery. Eating and drinking  Follow a diet as told by your doctor.  When you feel better, your doctor may tell you to change your diet. You may need to eat a lot of fiber. Fiber makes it easier to poop (have a bowel movement). Foods with fiber include: ? Berries. ? Beans. ? Lentils. ? Green vegetables.  Avoid eating red meat.   General instructions  Do not use any products that contain nicotine or tobacco, such as cigarettes, e-cigarettes, and chewing tobacco. If you need help quitting, ask your doctor.  Exercise 3 or more times a week. Try to get 30 minutes each time. Exercise enough to sweat and make your heart beat faster.  Keep all follow-up visits as told by your doctor. This is important. Contact a doctor if:  Your pain does not get better.  You are not pooping like normal. Get help right away if:  Your pain gets worse.  Your symptoms do not get better.  Your symptoms get worse very fast.  You have a fever.  You vomit more than one time.  You have poop that is: ? Bloody. ? Black. ? Tarry. Summary  This condition happens when   small pouches in your colon get infected or swollen.  Take medicines only as told by your doctor.  Follow a diet as told by your doctor.  Keep all follow-up visits as told by your doctor. This is important. This information is not intended to replace advice given to you by your health care provider. Make sure you discuss any questions you have with your health care provider. Document Revised: 02/01/2019 Document Reviewed: 02/01/2019 Elsevier Patient Education  2021 [employer]  

## 2020-09-08 NOTE — Telephone Encounter (Signed)
Erica notified pt of appt when she called back

## 2020-09-08 NOTE — Progress Notes (Signed)
Patient ID: Sandra Zavala, female    DOB: 09-26-1955, 65 y.o.   MRN: [id number]   Chief Complaint  Patient presents with  . adbominal pain / deverticulitis flare    X 6 days - had some left over antibiotics( flagyl/cipro)  took 2 days    Subjective:  CC: diverticula flare started on Sunday night  This is a chronic problem.  Presents today with a complaint of diverticulitis flare.  Reports the pain is 4/10.  Reports that she had leftover Flagyl and Cipro, taking it on Monday and Tuesday, pain resolved on Wednesday, Thursday night pain started again.  Reports today for refill on Flagyl and Cipro.  Denies fever, chills, chest pain, shortness of breath.  Endorses abdominal pain and constipation, negative for blood in stool.  This is a chronic problem.  Last colonoscopy done 2019 where many diverticuli were noted throughout her colon.    Medical History Sandra Zavala has a past medical history of Hypothyroid.   Outpatient Encounter Medications as of 09/08/2020  Medication Sig  . ciprofloxacin (CIPRO) 500 MG tablet Take 1 tablet (500 mg total) by mouth 2 (two) times daily for 10 days.  . metroNIDAZOLE (FLAGYL) 500 MG tablet Take 1 tablet (500 mg total) by mouth 3 (three) times daily for 7 days.  [name] alendronate (FOSAMAX) 70 MG tablet Take 1 tablet (70 mg total) by mouth every 7 (seven) days. Take with a full glass of water on an empty stomach.  . calcium carbonate (OS-CAL) 600 MG TABS Take 600 mg by mouth 2 (two) times daily with a meal.   . Cholecalciferol (VITAMIN D3) 2000 UNITS TABS Take 2,000 Units by mouth 2 (two) times daily.   [name] ibuprofen (ADVIL,MOTRIN) 200 MG tablet Take 400-600 mg by mouth every 8 (eight) hours as needed for headache or mild pain.  [name] levothyroxine (SYNTHROID) 125 MCG tablet TAKE 1 TABLET DAILY.   NEED FOLLOW UP VISIT  . triamcinolone (NASACORT) 55 MCG/ACT AERO nasal inhaler Place 2 sprays into the nose daily.  . vitamin C (ASCORBIC ACID) 500 MG tablet Take 500 mg by mouth  daily.   No facility-administered encounter medications on file as of 09/08/2020.     Review of Systems  Constitutional: Negative for chills and fever.  Respiratory: Negative for shortness of breath.   Cardiovascular: Negative for chest pain.  Gastrointestinal: Positive for abdominal pain and constipation. Negative for blood in stool.  Genitourinary: Negative for dysuria.     Vitals BP 126/80   Pulse 78   Temp 98.1 F (36.7 C)   Ht 4' 10.5" (1.486 m)   Wt 171 lb (77.6 kg)   SpO2 96%   BMI 35.13 kg/m   Objective:   Physical Exam Vitals reviewed.  Constitutional:      Appearance: Normal appearance.  Cardiovascular:     Rate and Rhythm: Normal rate and regular rhythm.     Heart sounds: Normal heart sounds.  Pulmonary:     Effort: Pulmonary effort is normal.     Breath sounds: Normal breath sounds.  Abdominal:     General: Bowel sounds are normal.     Tenderness: There is no abdominal tenderness.  Skin:    General: Skin is warm and dry.  Neurological:     General: No focal deficit present.     Mental Status: She is alert.  Psychiatric:        Behavior: Behavior normal.      Assessment and Plan   1. Diverticulitis -  ciprofloxacin (CIPRO) 500 MG tablet; Take 1 tablet (500 mg total) by mouth 2 (two) times daily for 10 days.  Dispense: 20 tablet; Refill: 0 - metroNIDAZOLE (FLAGYL) 500 MG tablet; Take 1 tablet (500 mg total) by mouth 3 (three) times daily for 7 days.  Dispense: 21 tablet; Refill: 0 - CT Abdomen Pelvis W Contrast   Will treat diverticulitis with Cipro Flagyl, prescription sent.  CT abdomen and pelvis ordered for updated assessment of severity of diverticulitis.  She has had frequent flares recently, at risk for rupture.  Abdominal exam in office fairly benign.  No tenderness noted upon physical exam.  Agrees with plan of care discussed today. Understands warning signs to seek further care: chest pain, shortness of breath, any significant change in  health.  Understands to follow-up with CT abdomen pelvis for further evaluation of diverticulitis, follow-up can be determined once the results are available.  Continue with Cipro Flagyl, warnings discussed on seeking attention at emergency room, understands risk of rupture.    Sandra Ades, NP 09/08/2020

## 2020-09-14 ENCOUNTER — Telehealth: Payer: Self-pay | Admitting: Family Medicine

## 2020-10-02 ENCOUNTER — Other Ambulatory Visit: Payer: Self-pay | Admitting: Family Medicine

## 2020-10-04 ENCOUNTER — Other Ambulatory Visit: Payer: Self-pay | Admitting: Nurse Practitioner

## 2020-10-06 ENCOUNTER — Encounter: Payer: Self-pay | Admitting: Nurse Practitioner

## 2020-10-06 DIAGNOSIS — Z13220 Encounter for screening for lipoid disorders: Secondary | ICD-10-CM | POA: Diagnosis not present

## 2020-10-06 DIAGNOSIS — Z79899 Other long term (current) drug therapy: Secondary | ICD-10-CM | POA: Diagnosis not present

## 2020-10-06 DIAGNOSIS — R739 Hyperglycemia, unspecified: Secondary | ICD-10-CM | POA: Diagnosis not present

## 2020-10-06 DIAGNOSIS — E039 Hypothyroidism, unspecified: Secondary | ICD-10-CM | POA: Diagnosis not present

## 2020-10-07 LAB — CBC WITH DIFFERENTIAL/PLATELET
Basophils Absolute: 0 10*3/uL (ref 0.0–0.2)
Basos: 0 %
EOS (ABSOLUTE): 0.1 10*3/uL (ref 0.0–0.4)
Eos: 1 %
Hematocrit: 46.9 % — ABNORMAL HIGH (ref 34.0–46.6)
Hemoglobin: 15.3 g/dL (ref 11.1–15.9)
Immature Grans (Abs): 0 10*3/uL (ref 0.0–0.1)
Immature Granulocytes: 0 %
Lymphocytes Absolute: 1.6 10*3/uL (ref 0.7–3.1)
Lymphs: 23 %
MCH: 28 pg (ref 26.6–33.0)
MCHC: 32.6 g/dL (ref 31.5–35.7)
MCV: 86 fL (ref 79–97)
Monocytes Absolute: 0.6 10*3/uL (ref 0.1–0.9)
Monocytes: 9 %
Neutrophils Absolute: 4.7 10*3/uL (ref 1.4–7.0)
Neutrophils: 67 %
Platelets: 269 10*3/uL (ref 150–450)
RBC: 5.47 x10E6/uL — ABNORMAL HIGH (ref 3.77–5.28)
RDW: 12.7 % (ref 11.7–15.4)
WBC: 7.1 10*3/uL (ref 3.4–10.8)

## 2020-10-07 LAB — LIPID PANEL
Chol/HDL Ratio: 3 ratio (ref 0.0–4.4)
Cholesterol, Total: 160 mg/dL (ref 100–199)
HDL: 53 mg/dL (ref >39)
LDL Chol Calc (NIH): 93 mg/dL (ref 0–99)
Triglycerides: 73 mg/dL (ref 0–149)
VLDL Cholesterol Cal: 14 mg/dL (ref 5–40)

## 2020-10-07 LAB — COMPREHENSIVE METABOLIC PANEL
ALT: 32 IU/L (ref 0–32)
AST: 23 IU/L (ref 0–40)
Albumin/Globulin Ratio: 2.7 — ABNORMAL HIGH (ref 1.2–2.2)
Albumin: 4.6 g/dL (ref 3.8–4.8)
Alkaline Phosphatase: 99 IU/L (ref 44–121)
BUN/Creatinine Ratio: 20 (ref 12–28)
BUN: 12 mg/dL (ref 8–27)
Bilirubin Total: 0.4 mg/dL (ref 0.0–1.2)
CO2: 23 mmol/L (ref 20–29)
Calcium: 8.7 mg/dL (ref 8.7–10.3)
Chloride: 106 mmol/L (ref 96–106)
Creatinine, Ser: 0.61 mg/dL (ref 0.57–1.00)
Globulin, Total: 1.7 g/dL (ref 1.5–4.5)
Glucose: 98 mg/dL (ref 65–99)
Potassium: 4.5 mmol/L (ref 3.5–5.2)
Sodium: 142 mmol/L (ref 134–144)
Total Protein: 6.3 g/dL (ref 6.0–8.5)
eGFR: 100 mL/min/{1.73_m2} (ref >59)

## 2020-10-07 LAB — HEMOGLOBIN A1C
Est. average glucose Bld gHb Est-mCnc: 111 mg/dL
Hgb A1c MFr Bld: 5.5 % (ref 4.8–5.6)

## 2020-10-07 LAB — TSH: TSH: 1.49 u[IU]/mL (ref 0.450–4.500)

## 2020-10-12 ENCOUNTER — Other Ambulatory Visit: Payer: Self-pay

## 2020-10-12 ENCOUNTER — Ambulatory Visit (HOSPITAL_COMMUNITY)
Admission: RE | Admit: 2020-10-12 | Discharge: 2020-10-12 | Disposition: A | Discharge status: Home / Self Care | Payer: BC Managed Care – PPO | Source: Ambulatory Visit | Attending: Family Medicine | Admitting: Family Medicine

## 2020-10-12 DIAGNOSIS — K5730 Diverticulosis of large intestine without perforation or abscess without bleeding: Secondary | ICD-10-CM | POA: Diagnosis not present

## 2020-10-12 DIAGNOSIS — M533 Sacrococcygeal disorders, not elsewhere classified: Secondary | ICD-10-CM | POA: Diagnosis not present

## 2020-10-12 DIAGNOSIS — K5792 Diverticulitis of intestine, part unspecified, without perforation or abscess without bleeding: Secondary | ICD-10-CM | POA: Insufficient documentation

## 2020-10-12 DIAGNOSIS — M47816 Spondylosis without myelopathy or radiculopathy, lumbar region: Secondary | ICD-10-CM | POA: Diagnosis not present

## 2020-10-12 DIAGNOSIS — M47817 Spondylosis without myelopathy or radiculopathy, lumbosacral region: Secondary | ICD-10-CM | POA: Diagnosis not present

## 2020-10-12 MED ORDER — IOHEXOL 300 MG/ML  SOLN
100.0000 mL | Freq: Once | INTRAMUSCULAR | Status: AC | PRN
  Administered 2020-10-12: 100 mL via INTRAVENOUS

## 2020-10-13 ENCOUNTER — Ambulatory Visit (INDEPENDENT_AMBULATORY_CARE_PROVIDER_SITE_OTHER): Payer: BC Managed Care – PPO | Admitting: Nurse Practitioner

## 2020-10-13 VITALS — BP 128/81 | HR 79 | Temp 98.6°F | Ht 58.5 in | Wt 171.0 lb

## 2020-10-13 DIAGNOSIS — Z1151 Encounter for screening for human papillomavirus (HPV): Secondary | ICD-10-CM | POA: Diagnosis not present

## 2020-10-13 DIAGNOSIS — Z01419 Encounter for gynecological examination (general) (routine) without abnormal findings: Secondary | ICD-10-CM

## 2020-10-13 DIAGNOSIS — Z124 Encounter for screening for malignant neoplasm of cervix: Secondary | ICD-10-CM

## 2020-10-13 DIAGNOSIS — Z23 Encounter for immunization: Secondary | ICD-10-CM | POA: Diagnosis not present

## 2020-10-13 NOTE — Progress Notes (Addendum)
o  Subjective:    Patient ID: Sandra Zavala, female    DOB: [birth year], 65 y.o.   MRN: [id number]  HPI The patient comes in today for a wellness visit.    A review of their health history was completed.  A review of medications was also completed.  Any needed refills; switching to [facility]  Eating habits: good; low carb diet  Falls/  MVA accidents in past few months: none  Regular exercise: walks 3 miles 5 days per week  Specialist pt sees on regular basis: none  Preventative health issues were discussed.   Additional concerns: CT scan diverticulitis; requests having meds on hand in case needed in the future Same sexual partner; no vaginal bleeding or pelvic pain  Review of Systems  Constitutional:  Negative for activity change, appetite change and fatigue.  Respiratory:  Negative for cough, chest tightness, shortness of breath and wheezing.   Cardiovascular:  Negative for chest pain and leg swelling.  Gastrointestinal:  Negative for abdominal distention, abdominal pain, blood in stool, constipation, diarrhea, nausea and vomiting.  Genitourinary:  Negative for difficulty urinating, dysuria, enuresis, frequency, genital sores, pelvic pain, urgency, vaginal bleeding and vaginal discharge.  Depression screen [facility] 2/9 10/13/2020 09/08/2020 07/14/2020 10/13/2019 06/17/2018  Decreased Interest 0 0 0 0 0  Down, Depressed, Hopeless 0 0 0 0 0  PHQ - 2 Score 0 0 0 0 0       Objective:   Physical Exam Constitutional:      General: She is not in acute distress.    Appearance: She is well-developed.  Neck:     Thyroid: No thyromegaly.     Trachea: No tracheal deviation.     Comments: Thyroid non tender to palpation. No mass or goiter noted.  Cardiovascular:     Rate and Rhythm: Normal rate and regular rhythm.     Heart sounds: Normal heart sounds. No murmur heard. Pulmonary:     Effort: Pulmonary effort is normal.     Breath sounds: Normal breath sounds.  Chest:  Breasts:     Right: No swelling, inverted nipple, mass, skin change, tenderness, axillary adenopathy or supraclavicular adenopathy.     Left: Inverted nipple present. No swelling, mass, skin change, tenderness, axillary adenopathy or supraclavicular adenopathy.     Comments: Inverted nipple left breast since childhood. No change.  Abdominal:     General: There is no distension.     Palpations: Abdomen is soft.     Tenderness: There is no abdominal tenderness.  Genitourinary:    Vagina: Normal.     Comments: External GU: pale with atrophy. Vagina: pale and dry; Cervix normal in appearance. No CMT. Bimanual exam: no tenderness or obvious masses.  Musculoskeletal:     Cervical back: Normal range of motion and neck supple.  Lymphadenopathy:     Cervical: No cervical adenopathy.     Upper Body:     Right upper body: No supraclavicular, axillary or pectoral adenopathy.     Left upper body: No supraclavicular, axillary or pectoral adenopathy.  Skin:    General: Skin is warm and dry.  Neurological:     Mental Status: She is alert and oriented to person, place, and time.  Psychiatric:        Mood and Affect: Mood normal.        Behavior: Behavior normal.        Thought Content: Thought content normal.        Judgment: Judgment normal.  Today's Vitals   10/13/20 0950  BP: 128/81  Pulse: 79  Temp: 98.6 F (37 C)  SpO2: 98%  Weight: 171 lb (77.6 kg)  Height: 4' 10.5" (1.486 m)   Body mass index is 35.13 kg/m. Results for orders placed or performed in visit on 07/14/20  CBC with Differential  Result Value Ref Range   WBC 7.1 3.4 - 10.8 x10E3/uL   RBC 5.47 (H) 3.77 - 5.28 x10E6/uL   Hemoglobin 15.3 11.1 - 15.9 g/dL   Hematocrit 46.9 (H) 34.0 - 46.6 %   MCV 86 79 - 97 fL   MCH 28.0 26.6 - 33.0 pg   MCHC 32.6 31.5 - 35.7 g/dL   RDW 12.7 11.7 - 15.4 %   Platelets 269 150 - 450 x10E3/uL   Neutrophils 67 Not Estab. %   Lymphs 23 Not Estab. %   Monocytes 9 Not Estab. %   Eos 1 Not Estab. %    Basos 0 Not Estab. %   Neutrophils Absolute 4.7 1.4 - 7.0 x10E3/uL   Lymphocytes Absolute 1.6 0.7 - 3.1 x10E3/uL   Monocytes Absolute 0.6 0.1 - 0.9 x10E3/uL   EOS (ABSOLUTE) 0.1 0.0 - 0.4 x10E3/uL   Basophils Absolute 0.0 0.0 - 0.2 x10E3/uL   Immature Granulocytes 0 Not Estab. %   Immature Grans (Abs) 0.0 0.0 - 0.1 x10E3/uL  Comprehensive Metabolic Panel (CMET)  Result Value Ref Range   Glucose 98 65 - 99 mg/dL   BUN 12 8 - 27 mg/dL   Creatinine, Ser 0.61 0.57 - 1.00 mg/dL   eGFR 100 >59 mL/min/1.73   BUN/Creatinine Ratio 20 12 - 28   Sodium 142 134 - 144 mmol/L   Potassium 4.5 3.5 - 5.2 mmol/L   Chloride 106 96 - 106 mmol/L   CO2 23 20 - 29 mmol/L   Calcium 8.7 8.7 - 10.3 mg/dL   Total Protein 6.3 6.0 - 8.5 g/dL   Albumin 4.6 3.8 - 4.8 g/dL   Globulin, Total 1.7 1.5 - 4.5 g/dL   Albumin/Globulin Ratio 2.7 (H) 1.2 - 2.2   Bilirubin Total 0.4 0.0 - 1.2 mg/dL   Alkaline Phosphatase 99 44 - 121 IU/L   AST 23 0 - 40 IU/L   ALT 32 0 - 32 IU/L  Lipid Profile  Result Value Ref Range   Cholesterol, Total 160 100 - 199 mg/dL   Triglycerides 73 0 - 149 mg/dL   HDL 53 >39 mg/dL   VLDL Cholesterol Cal 14 5 - 40 mg/dL   LDL Chol Calc (NIH) 93 0 - 99 mg/dL   Chol/HDL Ratio 3.0 0.0 - 4.4 ratio  TSH  Result Value Ref Range   TSH 1.490 0.450 - 4.500 uIU/mL  Hemoglobin A1c  Result Value Ref Range   Hgb A1c MFr Bld 5.5 4.8 - 5.6 %   Est. average glucose Bld gHb Est-mCnc 111 mg/dL          Assessment & Plan:   Problem List Items Addressed This Visit   None Visit Diagnoses     Well woman exam    -  Primary   Relevant Orders   IGP, Aptima HPV   Screening for cervical cancer       Relevant Orders   IGP, Aptima HPV   Screening for HPV (human papillomavirus)       Relevant Orders   IGP, Aptima HPV   Encounter for immunization       Relevant Orders   Pneumococcal polysaccharide vaccine  23-valent greater than or equal to 2yo subcutaneous/IM (Completed)      Meds ordered  this encounter  Medications  . ciprofloxacin (CIPRO) 500 MG tablet    Sig: Take 1 tablet (500 mg total) by mouth 2 (two) times daily.    Dispense:  14 tablet    Refill:  0    Order Specific Question:   Supervising Provider    Answer:   Sallee Lange A [[id number]]  . metroNIDAZOLE (FLAGYL) 500 MG tablet    Sig: Take 1 tablet (500 mg total) by mouth 3 (three) times daily.    Dispense:  21 tablet    Refill:  0    Order Specific Question:   Supervising Provider    Answer:   Sallee Lange A [[id number]]  . ondansetron (ZOFRAN ODT) 4 MG disintegrating tablet    Sig: Take 1 tablet (4 mg total) by mouth every 8 (eight) hours as needed for nausea or vomiting.    Dispense:  20 tablet    Refill:  0    Order Specific Question:   Supervising Provider    Answer:   Sallee Lange A [[id number]]   Medications for diverticulitis sent in to have on hand in case of a flare up. Warning signs reviewed. Agrees to seek help immediately if worsening or new symptoms.  Encouraged continued healthy diet and regular activity. Consider COVID booster. Return in about 1 year (around 10/13/2021) for physical.

## 2020-10-14 ENCOUNTER — Encounter: Payer: Self-pay | Admitting: Nurse Practitioner

## 2020-10-14 MED ORDER — METRONIDAZOLE 500 MG PO TABS: 500.0000 mg | ORAL_TABLET | Freq: Three times a day (TID) | ORAL | 0 refills | Status: DC

## 2020-10-14 MED ORDER — ONDANSETRON 4 MG PO TBDP: 4.0000 mg | ORAL_TABLET | Freq: Three times a day (TID) | ORAL | 0 refills | Status: DC | PRN

## 2020-10-14 MED ORDER — CIPROFLOXACIN HCL 500 MG PO TABS: 500.0000 mg | ORAL_TABLET | Freq: Two times a day (BID) | ORAL | 0 refills | Status: DC

## 2020-10-18 LAB — IGP, APTIMA HPV: HPV Aptima: NEGATIVE

## 2020-11-16 ENCOUNTER — Other Ambulatory Visit (HOSPITAL_COMMUNITY): Payer: Self-pay | Admitting: Nurse Practitioner

## 2020-11-16 DIAGNOSIS — Z1231 Encounter for screening mammogram for malignant neoplasm of breast: Secondary | ICD-10-CM

## 2020-12-18 ENCOUNTER — Ambulatory Visit (HOSPITAL_COMMUNITY)
Admission: RE | Admit: 2020-12-18 | Discharge: 2020-12-18 | Disposition: A | Discharge status: Home / Self Care | Payer: BC Managed Care – PPO | Source: Ambulatory Visit | Attending: Nurse Practitioner | Admitting: Nurse Practitioner

## 2020-12-18 ENCOUNTER — Other Ambulatory Visit: Payer: Self-pay

## 2020-12-18 DIAGNOSIS — Z1231 Encounter for screening mammogram for malignant neoplasm of breast: Secondary | ICD-10-CM | POA: Diagnosis not present

## 2020-12-25 DIAGNOSIS — M1712 Unilateral primary osteoarthritis, left knee: Secondary | ICD-10-CM | POA: Diagnosis not present

## 2020-12-26 ENCOUNTER — Ambulatory Visit: Payer: BC Managed Care – PPO | Admitting: Gastroenterology

## 2020-12-26 ENCOUNTER — Encounter: Payer: Self-pay | Admitting: *Deleted

## 2020-12-26 ENCOUNTER — Encounter: Payer: Self-pay | Admitting: Gastroenterology

## 2020-12-26 ENCOUNTER — Other Ambulatory Visit: Payer: Self-pay

## 2020-12-26 VITALS — BP 143/80 | HR 78 | Temp 97.7°F | Ht 60.0 in | Wt 176.0 lb

## 2020-12-26 DIAGNOSIS — R1010 Upper abdominal pain, unspecified: Secondary | ICD-10-CM | POA: Diagnosis not present

## 2020-12-26 NOTE — Patient Instructions (Signed)
We are arranging an upper endoscopy with Dr. Abbey Chatters in the near future.  Avoid NSAIDs as you are doing.  Please call if any worsening!  Further recommendations to follow!  I enjoyed seeing you again today! As you know, I value our relationship and want to provide genuine, compassionate, and quality care. I welcome your feedback. If you receive a survey regarding your visit,  I greatly appreciate you taking time to fill this out. See you next time!  Annitta Needs, PhD, ANP-BC [facility]

## 2020-12-26 NOTE — H&P (View-Only) (Signed)
Referring Provider: Kathyrn Drown, MD Primary Care Physician:  Kathyrn Drown, MD  Chief Complaint  Patient presents with   Nausea    With vomiting   Diarrhea   Abdominal Pain    Mid upper abd, gnawing feeling. Goes away after vomits. Started Pepcid 1 week ago with no relief    HPI:   Sandra Zavala is a 65 y.o. female presenting today due to abdominal pain, N/V/D. Remote history of diverticulitis. Colonoscopy 2019: recto-sigmoid colon and sigmoid colon moderately redundant, multiple small and large-mouthed diverticula in recto-sigmoid, sigmoid, descending colon and splenic flexure.   Notes acute onset first of June, followed by 2 additional episodes a few weeks apart. Last episode on Sunday. Will have acute onset of nausea and has stomach bloating and discomfort in epigastric region. Gnawing. Takes Zofran, Tums, no improvement. Will vomit 3-7 separate times. Then diarrhea starts then fine. Will have slight chills. Then all resolved. No food triggers. Appetite is fine otherwise. No rectal bleeding or hematemesis. Started taking Pepcid last week but unsure if helping or not. Started eating yogurt. Always happens at night, later in evening. Was taking Motrin daily in May and June while helping fix up a house. No dysphagia. No weight loss or lack of appetite.   Past Medical History:  Diagnosis Date   Hypothyroid    Osteoporosis     Past Surgical History:  Procedure Laterality Date   CHOLECYSTECTOMY  1999   COLONOSCOPY N/A 06/26/2017   recto-sigmoid colon and sigmoid colon moderately redundant, multiple small and large-mouthed diverticula in recto-sigmoid, sigmoid, descending colon and splenic flexure.   TONSILECTOMY, ADENOIDECTOMY, BILATERAL MYRINGOTOMY AND TUBES  1962    Current Outpatient Medications  Medication Sig Dispense Refill   alendronate (FOSAMAX) 70 MG tablet TAKE 1 TABLET BY MOUTH EVERY 7 DAYS. TAKE WITH A FULL GLASS OF WATER ON AN EMPTY STOMACH. 12 tablet 0    calcium carbonate (OS-CAL) 600 MG TABS Take 600 mg by mouth 2 (two) times daily with a meal.      Cholecalciferol (VITAMIN D3) 2000 UNITS TABS Take 2,000 Units by mouth 2 (two) times daily.      Famotidine (PEPCID PO) Take by mouth daily.     ibuprofen (ADVIL,MOTRIN) 200 MG tablet Take 400-600 mg by mouth every 8 (eight) hours as needed for headache or mild pain.     levothyroxine (SYNTHROID) 125 MCG tablet TAKE 1 TABLET DAILY.   NEED FOLLOW UP VISIT 90 tablet 3   ondansetron (ZOFRAN ODT) 4 MG disintegrating tablet Take 1 tablet (4 mg total) by mouth every 8 (eight) hours as needed for nausea or vomiting. 20 tablet 0   triamcinolone (NASACORT) 55 MCG/ACT AERO nasal inhaler Place 2 sprays into the nose daily.     vitamin C (ASCORBIC ACID) 500 MG tablet Take 500 mg by mouth daily.     No current facility-administered medications for this visit.    Allergies as of 12/26/2020   (No Known Allergies)    Family History  Problem Relation Age of Onset   Osteoporosis Mother    Heart disease Mother 24       stent   Pancreatic cancer Father        unknown if primary pancreatic vs liver   Liver cancer Father        unknown if primary pancreatic vs liver   Colon cancer Neg Hx    Gastric cancer Neg Hx    Esophageal cancer  Neg Hx     Social History   Socioeconomic History   Marital status: Married    Spouse name: Not on file   Number of children: Not on file   Years of education: Not on file   Highest education level: Not on file  Occupational History   Not on file  Tobacco Use   Smoking status: Never   Smokeless tobacco: Never  Vaping Use   Vaping Use: Never used  Substance and Sexual Activity   Alcohol use: No   Drug use: No   Sexual activity: Yes  Other Topics Concern   Not on file  Social History Narrative   Not on file   Social Determinants of Health   Financial Resource Strain: Not on file  Food Insecurity: Not on file  Transportation Needs: Not on file  Physical  Activity: Not on file  Stress: Not on file  Social Connections: Not on file    Review of Systems: Gen: Denies fever, chills, anorexia. Denies fatigue, weakness, weight loss.  CV: Denies chest pain, palpitations, syncope, peripheral edema, and claudication. Resp: Denies dyspnea at rest, cough, wheezing, coughing up blood, and pleurisy. GI: see HPI Derm: Denies rash, itching, dry skin Psych: Denies depression, anxiety, memory loss, confusion. No homicidal or suicidal ideation.  Heme: Denies bruising, bleeding, and enlarged lymph nodes.  Physical Exam: BP (!) 143/80   Pulse 78   Temp 97.7 F (36.5 C) (Temporal)   Ht 5' (1.524 m)   Wt 176 lb (79.8 kg)   BMI 34.37 kg/m  General:   Alert and oriented. No distress noted. Pleasant and cooperative.  Head:  Normocephalic and atraumatic. Eyes:  Conjuctiva clear without scleral icterus. Mouth:  mask in place Cardiac: S1 S2 present without murmurs Lungs: clear bilaterally Abdomen:  +BS, soft, non-tender and non-distended. No rebound or guarding. No HSM or masses noted. Msk:  Symmetrical without gross deformities. Normal posture. Extremities:  Without edema. Neurologic:  Alert and  oriented x4 Psych:  Alert and cooperative. Normal mood and affect.  ASSESSMENT: Sandra Zavala is a 65 y.o. female presenting today with new onset episodic epigastric pain associated with N/V/D that is relieved thereafter; thus far, 3 episodes since June 2022. Between episodes, she has no symptoms whatsoever. CT on file from June unrevealing. She does endorse daily NSAID use for the months of May/June but has stopped taking this currently.   Will pursue diagnostic EGD in the near future. She is currently taking Pepcid. I have asked her to continue this for now until after EGD.    PLAN:  Proceed with upper endoscopy by Dr. Abbey Chatters in near future: the risks, benefits, and alternatives have been discussed with the patient in detail. The patient states understanding  and desires to proceed.   Further recommendations to follow   Annitta Needs, PhD, ANP-BC [facility]

## 2020-12-26 NOTE — Progress Notes (Signed)
Referring Provider: Kathyrn Drown, MD Primary Care Physician:  Kathyrn Drown, MD  Chief Complaint  Patient presents with   Nausea    With vomiting   Diarrhea   Abdominal Pain    Mid upper abd, gnawing feeling. Goes away after vomits. Started Pepcid 1 week ago with no relief    HPI:   Sandra Zavala is a 65 y.o. female presenting today due to abdominal pain, N/V/D. Remote history of diverticulitis. Colonoscopy 2019: recto-sigmoid colon and sigmoid colon moderately redundant, multiple small and large-mouthed diverticula in recto-sigmoid, sigmoid, descending colon and splenic flexure.   Notes acute onset first of June, followed by 2 additional episodes a few weeks apart. Last episode on Sunday. Will have acute onset of nausea and has stomach bloating and discomfort in epigastric region. Gnawing. Takes Zofran, Tums, no improvement. Will vomit 3-7 separate times. Then diarrhea starts then fine. Will have slight chills. Then all resolved. No food triggers. Appetite is fine otherwise. No rectal bleeding or hematemesis. Started taking Pepcid last week but unsure if helping or not. Started eating yogurt. Always happens at night, later in evening. Was taking Motrin daily in May and June while helping fix up a house. No dysphagia. No weight loss or lack of appetite.   Past Medical History:  Diagnosis Date   Hypothyroid    Osteoporosis     Past Surgical History:  Procedure Laterality Date   CHOLECYSTECTOMY  1999   COLONOSCOPY N/A 06/26/2017   recto-sigmoid colon and sigmoid colon moderately redundant, multiple small and large-mouthed diverticula in recto-sigmoid, sigmoid, descending colon and splenic flexure.   TONSILECTOMY, ADENOIDECTOMY, BILATERAL MYRINGOTOMY AND TUBES  1962    Current Outpatient Medications  Medication Sig Dispense Refill   alendronate (FOSAMAX) 70 MG tablet TAKE 1 TABLET BY MOUTH EVERY 7 DAYS. TAKE WITH A FULL GLASS OF WATER ON AN EMPTY STOMACH. 12 tablet 0    calcium carbonate (OS-CAL) 600 MG TABS Take 600 mg by mouth 2 (two) times daily with a meal.      Cholecalciferol (VITAMIN D3) 2000 UNITS TABS Take 2,000 Units by mouth 2 (two) times daily.      Famotidine (PEPCID PO) Take by mouth daily.     ibuprofen (ADVIL,MOTRIN) 200 MG tablet Take 400-600 mg by mouth every 8 (eight) hours as needed for headache or mild pain.     levothyroxine (SYNTHROID) 125 MCG tablet TAKE 1 TABLET DAILY.   NEED FOLLOW UP VISIT 90 tablet 3   ondansetron (ZOFRAN ODT) 4 MG disintegrating tablet Take 1 tablet (4 mg total) by mouth every 8 (eight) hours as needed for nausea or vomiting. 20 tablet 0   triamcinolone (NASACORT) 55 MCG/ACT AERO nasal inhaler Place 2 sprays into the nose daily.     vitamin C (ASCORBIC ACID) 500 MG tablet Take 500 mg by mouth daily.     No current facility-administered medications for this visit.    Allergies as of 12/26/2020   (No Known Allergies)    Family History  Problem Relation Age of Onset   Osteoporosis Mother    Heart disease Mother 21       stent   Pancreatic cancer Father        unknown if primary pancreatic vs liver   Liver cancer Father        unknown if primary pancreatic vs liver   Colon cancer Neg Hx    Gastric cancer Neg Hx    Esophageal cancer  Neg Hx     Social History   Socioeconomic History   Marital status: Married    Spouse name: Not on file   Number of children: Not on file   Years of education: Not on file   Highest education level: Not on file  Occupational History   Not on file  Tobacco Use   Smoking status: Never   Smokeless tobacco: Never  Vaping Use   Vaping Use: Never used  Substance and Sexual Activity   Alcohol use: No   Drug use: No   Sexual activity: Yes  Other Topics Concern   Not on file  Social History Narrative   Not on file   Social Determinants of Health   Financial Resource Strain: Not on file  Food Insecurity: Not on file  Transportation Needs: Not on file  Physical  Activity: Not on file  Stress: Not on file  Social Connections: Not on file    Review of Systems: Gen: Denies fever, chills, anorexia. Denies fatigue, weakness, weight loss.  CV: Denies chest pain, palpitations, syncope, peripheral edema, and claudication. Resp: Denies dyspnea at rest, cough, wheezing, coughing up blood, and pleurisy. GI: see HPI Derm: Denies rash, itching, dry skin Psych: Denies depression, anxiety, memory loss, confusion. No homicidal or suicidal ideation.  Heme: Denies bruising, bleeding, and enlarged lymph nodes.  Physical Exam: BP (!) 143/80   Pulse 78   Temp 97.7 F (36.5 C) (Temporal)   Ht 5' (1.524 m)   Wt 176 lb (79.8 kg)   BMI 34.37 kg/m  General:   Alert and oriented. No distress noted. Pleasant and cooperative.  Head:  Normocephalic and atraumatic. Eyes:  Conjuctiva clear without scleral icterus. Mouth:  mask in place Cardiac: S1 S2 present without murmurs Lungs: clear bilaterally Abdomen:  +BS, soft, non-tender and non-distended. No rebound or guarding. No HSM or masses noted. Msk:  Symmetrical without gross deformities. Normal posture. Extremities:  Without edema. Neurologic:  Alert and  oriented x4 Psych:  Alert and cooperative. Normal mood and affect.  ASSESSMENT: Sandra Zavala is a 65 y.o. female presenting today with new onset episodic epigastric pain associated with N/V/D that is relieved thereafter; thus far, 3 episodes since June 2022. Between episodes, she has no symptoms whatsoever. CT on file from June unrevealing. She does endorse daily NSAID use for the months of May/June but has stopped taking this currently.   Will pursue diagnostic EGD in the near future. She is currently taking Pepcid. I have asked her to continue this for now until after EGD.    PLAN:  Proceed with upper endoscopy by Dr. Abbey Chatters in near future: the risks, benefits, and alternatives have been discussed with the patient in detail. The patient states understanding  and desires to proceed.   Further recommendations to follow   Annitta Needs, PhD, ANP-BC [facility]

## 2020-12-29 ENCOUNTER — Other Ambulatory Visit: Payer: Self-pay | Admitting: Nurse Practitioner

## 2021-01-02 ENCOUNTER — Encounter (HOSPITAL_COMMUNITY): Payer: Self-pay

## 2021-01-02 ENCOUNTER — Other Ambulatory Visit: Payer: Self-pay

## 2021-01-02 ENCOUNTER — Encounter (HOSPITAL_COMMUNITY)
Admission: RE | Disposition: A | Discharge status: Home / Self Care | Payer: Self-pay | Source: Home / Self Care | Attending: Internal Medicine

## 2021-01-02 ENCOUNTER — Ambulatory Visit (HOSPITAL_COMMUNITY)
Admission: RE | Admit: 2021-01-02 | Discharge: 2021-01-02 | Disposition: A | Discharge status: Home / Self Care | Payer: BC Managed Care – PPO | Attending: Internal Medicine | Admitting: Internal Medicine

## 2021-01-02 ENCOUNTER — Ambulatory Visit (HOSPITAL_COMMUNITY): Payer: BC Managed Care – PPO | Admitting: Anesthesiology

## 2021-01-02 DIAGNOSIS — Z791 Long term (current) use of non-steroidal anti-inflammatories (NSAID): Secondary | ICD-10-CM | POA: Diagnosis not present

## 2021-01-02 DIAGNOSIS — R112 Nausea with vomiting, unspecified: Secondary | ICD-10-CM | POA: Insufficient documentation

## 2021-01-02 DIAGNOSIS — Z79899 Other long term (current) drug therapy: Secondary | ICD-10-CM | POA: Insufficient documentation

## 2021-01-02 DIAGNOSIS — E039 Hypothyroidism, unspecified: Secondary | ICD-10-CM | POA: Diagnosis not present

## 2021-01-02 DIAGNOSIS — Z800 Family history of malignant neoplasm of digestive organs: Secondary | ICD-10-CM | POA: Insufficient documentation

## 2021-01-02 DIAGNOSIS — K449 Diaphragmatic hernia without obstruction or gangrene: Secondary | ICD-10-CM | POA: Insufficient documentation

## 2021-01-02 DIAGNOSIS — Z8249 Family history of ischemic heart disease and other diseases of the circulatory system: Secondary | ICD-10-CM | POA: Insufficient documentation

## 2021-01-02 DIAGNOSIS — K2970 Gastritis, unspecified, without bleeding: Secondary | ICD-10-CM

## 2021-01-02 DIAGNOSIS — R1013 Epigastric pain: Secondary | ICD-10-CM | POA: Insufficient documentation

## 2021-01-02 DIAGNOSIS — Z8262 Family history of osteoporosis: Secondary | ICD-10-CM | POA: Diagnosis not present

## 2021-01-02 HISTORY — PX: ESOPHAGOGASTRODUODENOSCOPY (EGD) WITH PROPOFOL: SHX5813

## 2021-01-02 HISTORY — PX: BIOPSY: SHX5522

## 2021-01-02 SURGERY — ESOPHAGOGASTRODUODENOSCOPY (EGD) WITH PROPOFOL
Anesthesia: General

## 2021-01-02 MED ORDER — LACTATED RINGERS IV SOLN
INTRAVENOUS | Status: DC
  Administered 2021-01-02: 1000 mL via INTRAVENOUS

## 2021-01-02 MED ORDER — OMEPRAZOLE 20 MG PO CPDR: 20.0000 mg | DELAYED_RELEASE_CAPSULE | Freq: Two times a day (BID) | ORAL | 5 refills | Status: DC

## 2021-01-02 MED ORDER — STERILE WATER FOR IRRIGATION IR SOLN
Status: DC | PRN
  Administered 2021-01-02: 100 mL

## 2021-01-02 MED ORDER — PROPOFOL 10 MG/ML IV BOLUS
INTRAVENOUS | Status: DC | PRN
  Administered 2021-01-02: 100 mg via INTRAVENOUS
  Administered 2021-01-02: 50 mg via INTRAVENOUS

## 2021-01-02 MED ORDER — LIDOCAINE HCL (CARDIAC) PF 100 MG/5ML IV SOSY
PREFILLED_SYRINGE | INTRAVENOUS | Status: DC | PRN
  Administered 2021-01-02: 100 mg via INTRAVENOUS

## 2021-01-02 NOTE — Interval H&P Note (Signed)
History and Physical Interval Note:  01/02/2021 9:16 AM  Sandra Zavala  has presented today for surgery, with the diagnosis of dyspepsia.  The various methods of treatment have been discussed with the patient and family. After consideration of risks, benefits and other options for treatment, the patient has consented to  Procedure(s) with comments: ESOPHAGOGASTRODUODENOSCOPY (EGD) WITH PROPOFOL (N/A) - 10:00am as a surgical intervention.  The patient's history has been reviewed, patient examined, no change in status, stable for surgery.  I have reviewed the patient's chart and labs.  Questions were answered to the patient's satisfaction.     Eloise Harman

## 2021-01-02 NOTE — Anesthesia Postprocedure Evaluation (Signed)
Anesthesia Post Note  Patient: Sandra Zavala  Procedure(s) Performed: ESOPHAGOGASTRODUODENOSCOPY (EGD) WITH PROPOFOL BIOPSY  Patient location during evaluation: Endoscopy Anesthesia Type: General Level of consciousness: awake and alert and oriented Pain management: pain level controlled Vital Signs Assessment: post-procedure vital signs reviewed and stable Respiratory status: spontaneous breathing and respiratory function stable Cardiovascular status: blood pressure returned to baseline and stable Postop Assessment: no apparent nausea or vomiting Anesthetic complications: no   No notable events documented.   Last Vitals:  Vitals:   01/02/21 0850 01/02/21 0937  BP: (!) 141/74 (!) 113/48  Pulse: 74   Resp: 20 18  Temp: 36.6 C 36.7 C  SpO2: 98% 96%    Last Pain:  Vitals:   01/02/21 0937  TempSrc: Oral  PainSc: 0-No pain                 Sutton Plake C Qamar Rosman

## 2021-01-02 NOTE — Discharge Instructions (Addendum)
EGD Discharge instructions Please read the instructions outlined below and refer to this sheet in the next few weeks. These discharge instructions provide you with general information on caring for yourself after you leave the hospital. Your doctor may also give you specific instructions. While your treatment has been planned according to the most current medical practices available, unavoidable complications occasionally occur. If you have any problems or questions after discharge, please call your doctor. ACTIVITY You may resume your regular activity but move at a slower pace for the next 24 hours.  Take frequent rest periods for the next 24 hours.  Walking will help expel (get rid of) the air and reduce the bloated feeling in your abdomen.  No driving for 24 hours (because of the anesthesia (medicine) used during the test).  You may shower.  Do not sign any important legal documents or operate any machinery for 24 hours (because of the anesthesia used during the test).  NUTRITION Drink plenty of fluids.  You may resume your normal diet.  Begin with a light meal and progress to your normal diet.  Avoid alcoholic beverages for 24 hours or as instructed by your caregiver.  MEDICATIONS You may resume your normal medications unless your caregiver tells you otherwise.  WHAT YOU CAN EXPECT TODAY You may experience abdominal discomfort such as a feeling of fullness or "gas" pains.  FOLLOW-UP Your doctor will discuss the results of your test with you.  SEEK IMMEDIATE MEDICAL ATTENTION IF ANY OF THE FOLLOWING OCCUR: Excessive nausea (feeling sick to your stomach) and/or vomiting.  Severe abdominal pain and distention (swelling).  Trouble swallowing.  Temperature over 101 F (37.8 C).  Rectal bleeding or vomiting of blood.   Your EGD revealed a moderate amount of inflammation in your stomach with multiple linear erosions.  I took biopsies to rule out infection with a bacteria called H. pylori.   Await pathology results, my office will contact you.  I am going to start you on a new medication called omeprazole 20 mg twice daily.  This medication works best if you take it 30 minutes before breakfast and 30 minutes for dinner.  I would stay on this for at least 8 to 12 weeks and then you can decrease back down to once daily thereafter.  Otherwise follow-up with GI in 3 to 4 months.   I hope you have a great rest of your week!  Elon Alas. Abbey Chatters, D.O. Gastroenterology and Hepatology [facility]

## 2021-01-02 NOTE — Anesthesia Preprocedure Evaluation (Addendum)
Anesthesia Evaluation  Patient identified by MRN, date of birth, ID band Patient awake    Reviewed: Allergy & Precautions, NPO status , Patient's Chart, lab work & pertinent test results  History of Anesthesia Complications Negative for: history of anesthetic complications  Airway Mallampati: I  TM Distance: >3 FB Neck ROM: Full    Dental  (+) Dental Advisory Given, Teeth Intact   Pulmonary neg pulmonary ROS,    Pulmonary exam normal breath sounds clear to auscultation       Cardiovascular Exercise Tolerance: Good Normal cardiovascular exam Rhythm:Regular Rate:Normal     Neuro/Psych  Neuromuscular disease negative psych ROS   GI/Hepatic Neg liver ROS, GERD (nausea/vomiting)  ,  Endo/Other  Hypothyroidism   Renal/GU negative Renal ROS     Musculoskeletal negative musculoskeletal ROS (+)   Abdominal   Peds  Hematology negative hematology ROS (+)   Anesthesia Other Findings   Reproductive/Obstetrics negative OB ROS                            Anesthesia Physical Anesthesia Plan  ASA: 2  Anesthesia Plan: General   Post-op Pain Management:    Induction: Intravenous  PONV Risk Score and Plan: TIVA  Airway Management Planned: Nasal Cannula and Natural Airway  Additional Equipment:   Intra-op Plan:   Post-operative Plan:   Informed Consent: I have reviewed the patients History and Physical, chart, labs and discussed the procedure including the risks, benefits and alternatives for the proposed anesthesia with the patient or authorized representative who has indicated his/her understanding and acceptance.     Dental advisory given  Plan Discussed with: CRNA and Surgeon  Anesthesia Plan Comments:         Anesthesia Quick Evaluation

## 2021-01-02 NOTE — Transfer of Care (Signed)
Immediate Anesthesia Transfer of Care Note  Patient: Sandra Zavala  Procedure(s) Performed: ESOPHAGOGASTRODUODENOSCOPY (EGD) WITH PROPOFOL BIOPSY  Patient Location: Endoscopy Unit  Anesthesia Type:General  Level of Consciousness: drowsy  Airway & Oxygen Therapy: Patient Spontanous Breathing  Post-op Assessment: Report given to RN and Post -op Vital signs reviewed and stable  Post vital signs: Reviewed and stable  Last Vitals:  Vitals Value Taken Time  BP    Temp    Pulse 78 01/02/21  0936  Resp 16 01/02/21  0936  SpO2 98  01/02/21  0936    Last Pain:  Vitals:   01/02/21 0937  TempSrc: Oral  PainSc:       Patients Stated Pain Goal: 5 ([id number])  Complications: No notable events documented.

## 2021-01-02 NOTE — Op Note (Signed)
[facility] Patient Name: Sandra Zavala Procedure Date: 01/02/2021 9:12 AM MRN: [id number] Date of Birth: 11-16-1955 Attending MD: Elon Alas. Abbey Chatters DO CSN: [id number] Age: 65 Admit Type: Outpatient Procedure:                Upper GI endoscopy Indications:              Epigastric abdominal pain, Nausea with vomiting Providers:                Elon Alas. Abbey Chatters, DO, Crystal Page, Nelma Rothman,                            Technician Referring MD:              Medicines:                See the Anesthesia note for documentation of the                            administered medications Complications:            No immediate complications. Estimated Blood Loss:     Estimated blood loss was minimal. Procedure:                Pre-Anesthesia Assessment:                           - The anesthesia plan was to use monitored                            anesthesia care (MAC).                           After obtaining informed consent, the endoscope was                            passed under direct vision. Throughout the                            procedure, the patient's blood pressure, pulse, and                            oxygen saturations were monitored continuously. The                            [id number]-[id number] ([id number]) scope was introduced through the                            mouth, and advanced to the second part of duodenum.                            The upper GI endoscopy was accomplished without                            difficulty. The patient tolerated the procedure                            well. Scope In:  9:28:31 AM Scope Out: 9:33:11 AM Total Procedure Duration: 0 hours 4 minutes 40 seconds  Findings:      A small hiatal hernia was present.      Moderate inflammation characterized by linear erosions was found in the       gastric fundus. Biopsies were taken with a cold forceps for Helicobacter       pylori testing.      The duodenal bulb, first portion of the duodenum, second  portion of the       duodenum and third portion of the duodenum were normal. Impression:               - Small hiatal hernia.                           - Gastritis. Biopsied.                           - Normal duodenal bulb, first portion of the                            duodenum, second portion of the duodenum and third                            portion of the duodenum. Moderate Sedation:      Per Anesthesia Care Recommendation:           - Patient has a contact number available for                            emergencies. The signs and symptoms of potential                            delayed complications were discussed with the                            patient. Return to normal activities tomorrow.                            Written discharge instructions were provided to the                            patient.                           - Resume previous diet.                           - Continue present medications.                           - Await pathology results.                           - Use Prilosec (omeprazole) 20 mg PO BID.                           - No ibuprofen, naproxen, or other non-steroidal  anti-inflammatory drugs.                           - Return to GI clinic in 3 months. Procedure Code(s):        --- Professional ---                           [phone], Esophagogastroduodenoscopy, flexible,                            transoral; with biopsy, single or multiple Diagnosis Code(s):        --- Professional ---                           K44.9, Diaphragmatic hernia without obstruction or                            gangrene                           K29.70, Gastritis, unspecified, without bleeding                           R10.13, Epigastric pain                           R11.2, Nausea with vomiting, unspecified CPT copyright 2019 American Medical Association. All rights reserved. The codes documented in this report are preliminary and upon  coder review may  be revised to meet current compliance requirements. Elon Alas. Abbey Chatters, DO [place] Abbey Chatters, DO 01/02/2021 9:36:21 AM This report has been signed electronically. Number of Addenda: 0

## 2021-01-03 LAB — SURGICAL PATHOLOGY

## 2021-01-10 ENCOUNTER — Encounter (HOSPITAL_COMMUNITY): Payer: Self-pay | Admitting: Internal Medicine

## 2021-01-29 ENCOUNTER — Ambulatory Visit
Admission: EM | Admit: 2021-01-29 | Discharge: 2021-01-29 | Disposition: A | Discharge status: Home / Self Care | Payer: BC Managed Care – PPO | Attending: Family Medicine | Admitting: Family Medicine

## 2021-01-29 ENCOUNTER — Other Ambulatory Visit: Payer: Self-pay

## 2021-01-29 DIAGNOSIS — R059 Cough, unspecified: Secondary | ICD-10-CM | POA: Diagnosis not present

## 2021-01-29 DIAGNOSIS — R062 Wheezing: Secondary | ICD-10-CM

## 2021-01-29 MED ORDER — PREDNISONE 20 MG PO TABS: 40.0000 mg | ORAL_TABLET | Freq: Every day | ORAL | 0 refills | Status: DC

## 2021-01-29 NOTE — ED Triage Notes (Signed)
Pt presents with complaints of chest congestion and wheezing that started on Saturday. Reports history of fall head colds but has never had the wheezing associated with it. Denies any fever.

## 2021-01-29 NOTE — ED Provider Notes (Signed)
[place]   [id number] 01/29/21 Arrival Time: 8841  ASSESSMENT & PLAN:  1. Cough   2. Wheezing     Discussed typical duration of viral illnesses. COVID-19 testing sent. OTC symptom care as needed.  Meds ordered this encounter  Medications   predniSONE (DELTASONE) 20 MG tablet    Sig: Take 2 tablets (40 mg total) by mouth daily.    Dispense:  10 tablet    Refill:  0     Follow-up Information     Kathyrn Drown, MD.   Specialty: Family Medicine Why: As needed. Contact information: [address] [place] [address] [phone]                 Reviewed expectations re: course of current medical issues. Questions answered. Outlined signs and symptoms indicating need for more acute intervention. Understanding verbalized. After Visit Summary given.   SUBJECTIVE: History from: patient. Sandra Zavala is a 65 y.o. female who reports recent cough; usu gets 1x/yr; wheezing past day; no SOB/CP; new symptom. Afebrile. Normal PO intake without n/v/d.   OBJECTIVE:  Vitals:   01/29/21 1032  BP: 135/85  Pulse: 75  Resp: 19  Temp: 98.3 F (36.8 C)  SpO2: 96%    General appearance: alert; no distress Eyes: PERRLA; EOMI; conjunctiva normal HENT: [place]; AT; with nasal congestion Neck: supple  Lungs: speaks full sentences without difficulty; unlabored; mild bilateral exp and insp wheezing Extremities: no edema Skin: warm and dry Neurologic: normal gait Psychological: alert and cooperative; normal mood and affect    No Known Allergies  Past Medical History:  Diagnosis Date   Hypothyroid    Osteoporosis    Social History   Socioeconomic History   Marital status: Married    Spouse name: Not on file   Number of children: Not on file   Years of education: Not on file   Highest education level: Not on file  Occupational History   Not on file  Tobacco Use   Smoking status: Never   Smokeless tobacco: Never  Vaping Use    Vaping Use: Never used  Substance and Sexual Activity   Alcohol use: No   Drug use: No   Sexual activity: Yes  Other Topics Concern   Not on file  Social History Narrative   Not on file   Social Determinants of Health   Financial Resource Strain: Not on file  Food Insecurity: Not on file  Transportation Needs: Not on file  Physical Activity: Not on file  Stress: Not on file  Social Connections: Not on file  Intimate Partner Violence: Not on file   Family History  Problem Relation Age of Onset   Osteoporosis Mother    Heart disease Mother 79       stent   Pancreatic cancer Father        unknown if primary pancreatic vs liver   Liver cancer Father        unknown if primary pancreatic vs liver   Colon cancer Neg Hx    Gastric cancer Neg Hx    Esophageal cancer Neg Hx    Past Surgical History:  Procedure Laterality Date   BIOPSY  01/02/2021   Procedure: BIOPSY;  Surgeon: Eloise Harman, DO;  Location: [facility];  Service: Endoscopy;;   CHOLECYSTECTOMY  1999   COLONOSCOPY N/A 06/26/2017   recto-sigmoid colon and sigmoid colon moderately redundant, multiple small and large-mouthed diverticula in recto-sigmoid, sigmoid, descending colon and  splenic flexure.   ESOPHAGOGASTRODUODENOSCOPY (EGD) WITH PROPOFOL N/A 01/02/2021   Procedure: ESOPHAGOGASTRODUODENOSCOPY (EGD) WITH PROPOFOL;  Surgeon: Eloise Harman, DO;  Location: [facility];  Service: Endoscopy;  Laterality: N/A;  10:00am   TONSILECTOMY, ADENOIDECTOMY, BILATERAL MYRINGOTOMY AND TUBES  9528     Vanessa Kick, MD 01/29/21 1101

## 2021-02-01 ENCOUNTER — Telehealth: Payer: Self-pay | Admitting: Emergency Medicine

## 2021-02-01 MED ORDER — ALBUTEROL SULFATE HFA 108 (90 BASE) MCG/ACT IN AERS: 2.0000 | INHALATION_SPRAY | RESPIRATORY_TRACT | 1 refills | Status: DC | PRN

## 2021-02-26 DIAGNOSIS — D225 Melanocytic nevi of trunk: Secondary | ICD-10-CM | POA: Diagnosis not present

## 2021-02-26 DIAGNOSIS — D2261 Melanocytic nevi of right upper limb, including shoulder: Secondary | ICD-10-CM | POA: Diagnosis not present

## 2021-02-26 DIAGNOSIS — L570 Actinic keratosis: Secondary | ICD-10-CM | POA: Diagnosis not present

## 2021-02-26 DIAGNOSIS — D235 Other benign neoplasm of skin of trunk: Secondary | ICD-10-CM | POA: Diagnosis not present

## 2021-02-26 DIAGNOSIS — D2262 Melanocytic nevi of left upper limb, including shoulder: Secondary | ICD-10-CM | POA: Diagnosis not present

## 2021-05-22 ENCOUNTER — Other Ambulatory Visit: Payer: Self-pay

## 2021-05-22 ENCOUNTER — Encounter: Payer: Self-pay | Admitting: Gastroenterology

## 2021-05-22 ENCOUNTER — Ambulatory Visit: Payer: BC Managed Care – PPO | Admitting: Gastroenterology

## 2021-05-22 DIAGNOSIS — K2960 Other gastritis without bleeding: Secondary | ICD-10-CM | POA: Diagnosis not present

## 2021-05-22 DIAGNOSIS — K2970 Gastritis, unspecified, without bleeding: Secondary | ICD-10-CM | POA: Insufficient documentation

## 2021-05-22 NOTE — Progress Notes (Signed)
Referring Provider: Kathyrn Drown, MD Primary Care Physician:  Kathyrn Drown, MD Primary GI: Dr. Abbey Chatters   Chief Complaint  Patient presents with   Abdominal Pain    Not having any since EGD.     HPI:   Sandra Zavala is a 66 y.o. female presenting today with a history of dyspepsia, undergoing EGD in interim with gastritis. She is here for routine follow-up. Colonoscopy on file from 2019.   3 bouts of nausea since EGD but Zofran took care of this. Was taking omeprazole BID after EGD. Around November, had stomach soreness and decreased omeprazole to once a day. Abdominal soreness stopped after that. End of November, started taking it every other day. She has had no further abdominal pain since EGD. Doing well. Has no complaints today. No dysphagia, constipation, diarrhea, changes in bowel habits, or overt GI bleeding.   Past Medical History:  Diagnosis Date   Hypothyroid    Osteoporosis     Past Surgical History:  Procedure Laterality Date   BIOPSY  01/02/2021   Procedure: BIOPSY;  Surgeon: Eloise Harman, DO;  Location: [facility];  Service: Endoscopy;;   CHOLECYSTECTOMY  1999   COLONOSCOPY N/A 06/26/2017   recto-sigmoid colon and sigmoid colon moderately redundant, multiple small and large-mouthed diverticula in recto-sigmoid, sigmoid, descending colon and splenic flexure.   ESOPHAGOGASTRODUODENOSCOPY (EGD) WITH PROPOFOL N/A 01/02/2021   small hiatal hernia, gastritis s/p biopsy (negative H.pylori, benign path), normal duodenum   TONSILECTOMY, ADENOIDECTOMY, BILATERAL MYRINGOTOMY AND TUBES  1962    Current Outpatient Medications  Medication Sig Dispense Refill   albuterol (VENTOLIN HFA) 108 (90 Base) MCG/ACT inhaler Inhale 2 puffs into the lungs every 4 (four) hours as needed for wheezing or shortness of breath. 18 g 1   alendronate (FOSAMAX) 70 MG tablet TAKE 1 TABLET BY MOUTH EVERY 7 DAYS. TAKE WITH A FULL GLASS OF WATER ON AN EMPTY STOMACH 12 tablet 3    calcium carbonate (OS-CAL) 600 MG TABS Take 1,200 mg by mouth daily with breakfast.     cholecalciferol (VITAMIN D) 25 MCG (1000 UNIT) tablet Take 1,000 Units by mouth daily.     famotidine-calcium carbonate-magnesium hydroxide (PEPCID COMPLETE) 10-800-165 MG chewable tablet Chew 1 tablet by mouth daily.     ibuprofen (ADVIL,MOTRIN) 200 MG tablet Take 400-600 mg by mouth every 8 (eight) hours as needed for headache or mild pain.     levothyroxine (SYNTHROID) 125 MCG tablet TAKE 1 TABLET DAILY.   NEED FOLLOW UP VISIT (Patient taking differently: Take 125 mcg by mouth daily before breakfast. TAKE 1 TABLET DAILY.   NEED FOLLOW UP VISIT) 90 tablet 3   Magnesium Oxide 200 MG TABS Take 200 mg by mouth daily.     Misc Natural Products (GLUCOSAMINE CHOND MSM FORMULA PO) Take 1 tablet by mouth daily.     Multiple Vitamins-Minerals (PRESERVISION AREDS 2+MULTI VIT PO) Take 1 capsule by mouth daily.     omeprazole (PRILOSEC) 20 MG capsule Take 1 capsule (20 mg total) by mouth 2 (two) times daily before a meal. Take 30 min before breakfast and 30 min before dinner (Patient taking differently: Take 20 mg by mouth. Every other day) 60 capsule 5   ondansetron (ZOFRAN ODT) 4 MG disintegrating tablet Take 1 tablet (4 mg total) by mouth every 8 (eight) hours as needed for nausea or vomiting. 20 tablet 0   Probiotic Product (PHILLIPS COLON HEALTH PO) Take 1 capsule by mouth daily.  triamcinolone (NASACORT) 55 MCG/ACT AERO nasal inhaler Place 2 sprays into the nose daily.     vitamin C (ASCORBIC ACID) 500 MG tablet Take 500 mg by mouth daily.     predniSONE (DELTASONE) 20 MG tablet Take 2 tablets (40 mg total) by mouth daily. (Patient not taking: Reported on 05/22/2021) 10 tablet 0   No current facility-administered medications for this visit.    Allergies as of 05/22/2021   (No Known Allergies)    Family History  Problem Relation Age of Onset   Osteoporosis Mother    Heart disease Mother 2       stent    Pancreatic cancer Father        unknown if primary pancreatic vs liver   Liver cancer Father        unknown if primary pancreatic vs liver   Colon cancer Neg Hx    Gastric cancer Neg Hx    Esophageal cancer Neg Hx    Colon polyps Neg Hx     Social History   Socioeconomic History   Marital status: Married    Spouse name: Not on file   Number of children: Not on file   Years of education: Not on file   Highest education level: Not on file  Occupational History   Not on file  Tobacco Use   Smoking status: Never   Smokeless tobacco: Never  Vaping Use   Vaping Use: Never used  Substance and Sexual Activity   Alcohol use: No   Drug use: No   Sexual activity: Yes  Other Topics Concern   Not on file  Social History Narrative   Not on file   Social Determinants of Health   Financial Resource Strain: Not on file  Food Insecurity: Not on file  Transportation Needs: Not on file  Physical Activity: Not on file  Stress: Not on file  Social Connections: Not on file    Review of Systems: Gen: Denies fever, chills, anorexia. Denies fatigue, weakness, weight loss.  CV: Denies chest pain, palpitations, syncope, peripheral edema, and claudication. Resp: Denies dyspnea at rest, cough, wheezing, coughing up blood, and pleurisy. GI See HPI Derm: Denies rash, itching, dry skin Psych: Denies depression, anxiety, memory loss, confusion. No homicidal or suicidal ideation.  Heme: Denies bruising, bleeding, and enlarged lymph nodes.  Physical Exam: BP 138/80    Pulse 71    Temp (!) 96.8 F (36 C)    Ht 5' (1.524 m)    Wt 180 lb 3.2 oz (81.7 kg)    BMI 35.19 kg/m  General:   Alert and oriented. No distress noted. Pleasant and cooperative.  Head:  Normocephalic and atraumatic. Eyes:  Conjuctiva clear without scleral icterus. Mouth:  mask in place Abdomen:  +BS, soft, non-tender and non-distended. No rebound or guarding. No HSM or masses noted. Msk:  Symmetrical without gross  deformities. Normal posture. Extremities:  Without edema. Neurologic:  Alert and  oriented x4 Psych:  Alert and cooperative. Normal mood and affect.  ASSESSMENT/PLAN: Sandra Zavala is a 66 y.o. female presenting today with history of dyspepsia, s/p EGD with gastritis. Symptoms now resolved after BID PPI therapy.   She is now taking omeprazole every other day and is curious about discontinuing this. We discussed she can trial off of it, but if she were to have recurrence of symptoms, she would need to resume. If she were to need additional NSAID therapy in the future, recommend taking PPI along with this.  We will see her in 1 year or sooner if needed.  Annitta Needs, PhD, ANP-BC [facility]

## 2021-05-22 NOTE — Patient Instructions (Signed)
You can stop the omeprazole, but if symptoms return, please start taking again daily to every other day.   If you happen to need NSAIDs, I recommend taking omeprazole with this.  We will see you in 1 year! Please call with any concerns!   I enjoyed seeing you again today! As you know, I value our relationship and want to provide genuine, compassionate, and quality care. I welcome your feedback. If you receive a survey regarding your visit,  I greatly appreciate you taking time to fill this out. See you next time!  Annitta Needs, PhD, ANP-BC [facility]

## 2021-06-28 ENCOUNTER — Other Ambulatory Visit: Payer: Self-pay | Admitting: Internal Medicine

## 2021-07-20 ENCOUNTER — Other Ambulatory Visit: Payer: Self-pay

## 2021-07-20 ENCOUNTER — Ambulatory Visit (INDEPENDENT_AMBULATORY_CARE_PROVIDER_SITE_OTHER): Payer: BC Managed Care – PPO | Admitting: Family Medicine

## 2021-07-20 VITALS — BP 123/70 | HR 85 | Temp 98.7°F | Ht 60.0 in | Wt 180.0 lb

## 2021-07-20 DIAGNOSIS — U071 COVID-19: Secondary | ICD-10-CM | POA: Diagnosis not present

## 2021-07-20 DIAGNOSIS — B9789 Other viral agents as the cause of diseases classified elsewhere: Secondary | ICD-10-CM

## 2021-07-20 DIAGNOSIS — J988 Other specified respiratory disorders: Secondary | ICD-10-CM | POA: Diagnosis not present

## 2021-07-20 NOTE — Patient Instructions (Signed)
Rest. Lots of fluids. ? ?Continue over the counter treatment. ? ?Awaiting test results. We will call with the result as soon as it is available. ? ?Take care ? ?Dr. Lacinda Axon  ?

## 2021-07-22 LAB — COVID-19, FLU A+B AND RSV
Influenza A, NAA: NOT DETECTED
Influenza B, NAA: NOT DETECTED
RSV, NAA: NOT DETECTED
SARS-CoV-2, NAA: DETECTED — AB

## 2021-07-22 LAB — SPECIMEN STATUS REPORT

## 2021-07-23 DIAGNOSIS — U071 COVID-19: Secondary | ICD-10-CM | POA: Insufficient documentation

## 2021-07-23 MED ORDER — NIRMATRELVIR/RITONAVIR (PAXLOVID)TABLET: 3.0000 | ORAL_TABLET | Freq: Two times a day (BID) | ORAL | 0 refills | Status: AC

## 2021-07-23 NOTE — Assessment & Plan Note (Signed)
Patient is tested positive for COVID-19.  Treating with Paxlovid. ?

## 2021-07-23 NOTE — Progress Notes (Signed)
? ?Subjective:  ?Patient ID: Sandra Zavala, female    DOB: 06-Jul-1955  Age: 65 y.o. MRN: [id number] ? ?CC: ?Chief Complaint  ?Patient presents with  ? Fever  ?  Congestion, cough and body aches since Wednesday ?Covid test negative this morning.  ? ? ?HPI: ? ?66 year old female presents for evaluation of respiratory symptoms. ? ?Patient has been sick since Wednesday.  She has had cough, congestion and body aches.  No documented fever at home.  Currently afebrile.  Has had some relief with over-the-counter treatment.  No known exacerbating factors.  No other complaints. ? ?Patient Active Problem List  ? Diagnosis Date Noted  ? COVID 07/23/2021  ? Diverticulitis 03/26/2018  ? Colon cancer screening   ? Hypothyroidism 06/16/2017  ? Osteoporosis 12/05/2012  ? ? ?Social Hx   ?Social History  ? ?Socioeconomic History  ? Marital status: Married  ?  Spouse name: Not on file  ? Number of children: Not on file  ? Years of education: Not on file  ? Highest education level: Not on file  ?Occupational History  ? Not on file  ?Tobacco Use  ? Smoking status: Never  ? Smokeless tobacco: Never  ?Vaping Use  ? Vaping Use: Never used  ?Substance and Sexual Activity  ? Alcohol use: No  ? Drug use: No  ? Sexual activity: Yes  ?Other Topics Concern  ? Not on file  ?Social History Narrative  ? Not on file  ? ?Social Determinants of Health  ? ?Financial Resource Strain: Not on file  ?Food Insecurity: Not on file  ?Transportation Needs: Not on file  ?Physical Activity: Not on file  ?Stress: Not on file  ?Social Connections: Not on file  ? ? ?Review of Systems ?Per HPI ? ?Objective:  ?BP 123/70   Pulse 85   Temp 98.7 ?F (37.1 ?C) (Oral)   Ht 5' (1.524 m)   Wt 180 lb (81.6 kg)   SpO2 98%   BMI 35.15 kg/m?  ? ?BP/Weight 07/20/2021 05/22/2021 01/29/2021  ?Systolic BP 794 801 655  ?Diastolic BP 70 80 85  ?Wt. (Lbs) 180 180.2 -  ?BMI 35.15 35.19 -  ? ? ?Physical Exam ?Vitals and nursing note reviewed.  ?Constitutional:   ?   General: She is not  in acute distress. ?   Appearance: Normal appearance.  ?HENT:  ?   Head: Normocephalic and atraumatic.  ?   Right Ear: Tympanic membrane normal.  ?   Left Ear: Tympanic membrane normal.  ?   Mouth/Throat:  ?   Pharynx: Oropharynx is clear.  ?Eyes:  ?   General:     ?   Right eye: No discharge.     ?   Left eye: No discharge.  ?   Conjunctiva/sclera: Conjunctivae normal.  ?Cardiovascular:  ?   Rate and Rhythm: Normal rate and regular rhythm.  ?Pulmonary:  ?   Effort: Pulmonary effort is normal.  ?   Breath sounds: Normal breath sounds. No wheezing, rhonchi or rales.  ?Neurological:  ?   Mental Status: She is alert.  ?Psychiatric:     ?   Mood and Affect: Mood normal.     ?   Behavior: Behavior normal.  ? ? ?Lab Results  ?Component Value Date  ? WBC 7.1 10/06/2020  ? HGB 15.3 10/06/2020  ? HCT 46.9 (H) 10/06/2020  ? PLT 269 10/06/2020  ? GLUCOSE 98 10/06/2020  ? CHOL 160 10/06/2020  ? TRIG 73 10/06/2020  ? HDL  53 10/06/2020  ? [place] 93 10/06/2020  ? ALT 32 10/06/2020  ? AST 23 10/06/2020  ? NA 142 10/06/2020  ? K 4.5 10/06/2020  ? CL 106 10/06/2020  ? CREATININE 0.61 10/06/2020  ? BUN 12 10/06/2020  ? CO2 23 10/06/2020  ? TSH 1.490 10/06/2020  ? HGBA1C 5.5 10/06/2020  ? ? ? ?Assessment & Plan:  ? ?Problem List Items Addressed This Visit   ? ?  ? Other  ? COVID - Primary  ?  Patient is tested positive for COVID-19.  Treating with Paxlovid. ?  ?  ? Relevant Medications  ? nirmatrelvir/ritonavir EUA (PAXLOVID) 20 x 150 MG & 10 x '100MG'$  TABS  ? Other Relevant Orders  ? COVID-19, Flu A+B and RSV (Completed)  ? Specimen status report (Completed)  ? ? ?Meds ordered this encounter  ?Medications  ? nirmatrelvir/ritonavir EUA (PAXLOVID) 20 x 150 MG & 10 x '100MG'$  TABS  ?  Sig: Take 3 tablets by mouth 2 (two) times daily for 5 days. Take nirmatrelvir 150 mg two tablets twice daily for 5 days and ritonavir 100 mg one tablet twice daily for 5 days. Patient GFR is >60.  ?  Dispense:  30 tablet  ?  Refill:  0  ? ?Thersa Salt  DO ?[place] ? ?

## 2021-08-29 ENCOUNTER — Ambulatory Visit: Payer: BC Managed Care – PPO | Admitting: Family Medicine

## 2021-08-29 ENCOUNTER — Encounter: Payer: Self-pay | Admitting: Family Medicine

## 2021-08-29 VITALS — BP 134/83 | HR 88 | Temp 96.3°F | Wt 171.6 lb

## 2021-08-29 DIAGNOSIS — H6982 Other specified disorders of Eustachian tube, left ear: Secondary | ICD-10-CM

## 2021-08-29 DIAGNOSIS — H93A2 Pulsatile tinnitus, left ear: Secondary | ICD-10-CM | POA: Diagnosis not present

## 2021-08-29 MED ORDER — AZELASTINE HCL 0.1 % NA SOLN: 2.0000 | Freq: Two times a day (BID) | NASAL | 12 refills | Status: DC

## 2021-08-29 NOTE — Progress Notes (Signed)
? ?  Subjective:  ? ? Patient ID: Sandra Zavala, female    DOB: [birth year], 66 y.o.   MRN: [id number] ? ?HPI ?Pt having whooshing sound in left ear along with being able to hear heart beat.No pain.  Today the sound has been non stop. Pt has sinus infection about a week and half to two weeks ago, that has resolved. Pt also COVID in March and had ringing in both ears.  ?Patient denies any severe headaches with this ?No vomiting or diarrhea ?No unilateral numbness weakness ? ?Review of Systems ? ?   ?Objective:  ? Physical Exam ?Gen-NAD not toxic ?TMS-normal bilateral ?T- normal no redness ?Chest-CTA respiratory rate normal no crackles ?CV RRR no murmur ?Skin-warm dry ?Neuro-grossly normal ? ?There is no sign of any stroke on today's exam ? ? ?   ?Assessment & Plan:  ?Go ahead with allergy nasal sprays ?We will go ahead with setting up MRI ?I did discuss this case with radiology ?Radiologist stated that the MRI with and without contrast with internal auditory canal focus is what is necessary for her left pulsatile tinnitus to rule out the possibility of vascular issues ?We will help set this up ? ?

## 2021-09-03 ENCOUNTER — Telehealth: Payer: Self-pay | Admitting: *Deleted

## 2021-09-03 NOTE — Telephone Encounter (Signed)
Patient left message on machine stating that she was seek last week due to left ear pain and hearing heartbeat in ear. Patient states she started the nasal spray as advised and the issue is 97% resolved and wondered if she still needed to follow thru with an MRi or just keep taking the nasal spray that is resolving the issue. Please advise ?

## 2021-09-03 NOTE — Telephone Encounter (Signed)
The MRI could be put on hold for now ?But this is based upon the following contingencies ?#1-her symptoms should completely go away within 2 weeks with the medication ?#2 if her symptoms do not go away she needs to notify us by phone call or MyChart message or office visit ?#3 May discontinue order for MRI currently until we see how the next 2 weeks ago ?#4 please make sure patient agrees to the above-if any questions concerns or issues please let me know ?

## 2021-09-03 NOTE — Telephone Encounter (Signed)
Patient advised of provider's recommendations and stated she will continue the nasal spray for the next 2 weeks and let th doctor know if the issue is not 100% resolved. ?

## 2021-09-03 NOTE — Progress Notes (Signed)
See telephone message 09/03/21 ?

## 2021-09-11 ENCOUNTER — Telehealth: Payer: Self-pay | Admitting: *Deleted

## 2021-09-11 NOTE — Telephone Encounter (Signed)
If it is still ongoing it is important for her to do a follow-up visit have to repeat evaluate as well as reestablish within her notes that she is now having problems with it again in order to get MRI covered ?Also would be recommended to initiate ENT referral but it could take up to a couple months to get in with ENT ?So therefore in the meantime I recommend a follow-up office visit here ?

## 2021-09-11 NOTE — Telephone Encounter (Signed)
Patient states it went away but returned a few days ago and it is the same thing she was feeling when she was seen and it has come and went the past few days ?

## 2021-09-11 NOTE — Telephone Encounter (Signed)
Patient left message stating the ringing in her ears had went away but did return and has been present off and on for a few days and wanted you to know and see if she needed to do anything different. ?

## 2021-09-11 NOTE — Telephone Encounter (Signed)
So the last time the patient was here the biggest concern was her feeling the pulse in her ear ?Currently right now when she says the ringing comes and goes does she have a more clear description? ?Is it ringing?  Or is it feeling the pulse in her ear? ?How often is that going on? ?We had discussed doing imaging but held off because she cleared out ?There is a potential possibility we may just want to get her set up with ENT ?Please find out additional info ?

## 2021-09-12 NOTE — Telephone Encounter (Signed)
Patient informed of md message and recommendations. Verbalized understanding. Appt made. ?

## 2021-09-17 ENCOUNTER — Ambulatory Visit: Payer: BC Managed Care – PPO | Admitting: Family Medicine

## 2021-09-17 ENCOUNTER — Encounter: Payer: Self-pay | Admitting: Family Medicine

## 2021-09-17 ENCOUNTER — Telehealth: Payer: Self-pay | Admitting: Nurse Practitioner

## 2021-09-17 VITALS — BP 134/84 | Temp 98.1°F | Wt 171.8 lb

## 2021-09-17 DIAGNOSIS — H6982 Other specified disorders of Eustachian tube, left ear: Secondary | ICD-10-CM | POA: Diagnosis not present

## 2021-09-17 MED ORDER — LEVOTHYROXINE SODIUM 125 MCG PO TABS: 125.0000 ug | ORAL_TABLET | Freq: Every day | ORAL | 1 refills | Status: DC

## 2021-09-17 NOTE — Telephone Encounter (Signed)
Patient has physical on 6/23 and needing labs done 

## 2021-09-17 NOTE — Progress Notes (Signed)
? ?  Subjective:  ? ? Patient ID: Sandra Zavala, female    DOB: 09-04-1955, 66 y.o.   MRN: [id number] ? ?HPI ?Pt was in on 08/29/21 for tinnitus of left ear. Pt states the ringing stop on that Thursday afternoon and did come back on Monday. Monday afternoon it started back non stop for several day. Not happening at this time. Has been rare the past few days. Is pt to continue on nasal spray? ? ? ?Review of Systems ? ?   ?Objective:  ? Physical Exam ? ?Minimal wax in both ear canals throat normal neck no masses lungs clear ? ? ?   ?Assessment & Plan:  ?Intermittent tinnitus could well be eustachian tube dysfunction very unlikely for patient to have structural problem if this comes and goes.  Hold off on any MRI currently.  Await input from ENT specialist before ordering any tests ? ?

## 2021-09-18 ENCOUNTER — Other Ambulatory Visit: Payer: Self-pay | Admitting: *Deleted

## 2021-09-18 DIAGNOSIS — R739 Hyperglycemia, unspecified: Secondary | ICD-10-CM

## 2021-09-18 DIAGNOSIS — E039 Hypothyroidism, unspecified: Secondary | ICD-10-CM

## 2021-09-18 DIAGNOSIS — Z13220 Encounter for screening for lipoid disorders: Secondary | ICD-10-CM

## 2021-09-18 DIAGNOSIS — Z01419 Encounter for gynecological examination (general) (routine) without abnormal findings: Secondary | ICD-10-CM

## 2021-09-18 DIAGNOSIS — Z79899 Other long term (current) drug therapy: Secondary | ICD-10-CM

## 2021-09-18 NOTE — Telephone Encounter (Signed)
Labs ordered in epic. Patient notified ?

## 2021-09-26 DIAGNOSIS — H25813 Combined forms of age-related cataract, bilateral: Secondary | ICD-10-CM | POA: Diagnosis not present

## 2021-09-26 NOTE — Progress Notes (Signed)
Message left for Dr Benjamine Mola to contact Dr Wolfgang Phoenix on his cell number (09/26/21)

## 2021-09-27 ENCOUNTER — Other Ambulatory Visit: Payer: Self-pay

## 2021-09-27 ENCOUNTER — Telehealth: Payer: Self-pay | Admitting: Family Medicine

## 2021-09-27 DIAGNOSIS — H93A2 Pulsatile tinnitus, left ear: Secondary | ICD-10-CM

## 2021-09-27 NOTE — Telephone Encounter (Signed)
Nurses Please let the patient know that I did discuss her case with Dr.Teoh.  He stated in the situations he would like to see her.  They will do an evaluation along with hearing test.  In some cases hearing damage can cause intermittent tinnitus.  But if her hearing test is normal and she is still having the pulsatile tinnitus he would pursue an MRI. Therefore we do recommend referral to ENT regarding pulsatile tinnitus for the above reasons thank you

## 2021-09-28 NOTE — Telephone Encounter (Signed)
Patient notified of provider's recommendations and states she has already scheduled her own appt with ENT in August

## 2021-10-05 ENCOUNTER — Other Ambulatory Visit: Payer: Self-pay | Admitting: Nurse Practitioner

## 2021-10-20 LAB — CBC WITH DIFFERENTIAL/PLATELET
Basophils Absolute: 0.1 10*3/uL (ref 0.0–0.2)
Basos: 1 %
EOS (ABSOLUTE): 0.1 10*3/uL (ref 0.0–0.4)
Eos: 1 %
Hematocrit: 46.8 % — ABNORMAL HIGH (ref 34.0–46.6)
Hemoglobin: 15.6 g/dL (ref 11.1–15.9)
Immature Grans (Abs): 0 10*3/uL (ref 0.0–0.1)
Immature Granulocytes: 0 %
Lymphocytes Absolute: 1.4 10*3/uL (ref 0.7–3.1)
Lymphs: 21 %
MCH: 28.2 pg (ref 26.6–33.0)
MCHC: 33.3 g/dL (ref 31.5–35.7)
MCV: 85 fL (ref 79–97)
Monocytes Absolute: 0.6 10*3/uL (ref 0.1–0.9)
Monocytes: 8 %
Neutrophils Absolute: 4.6 10*3/uL (ref 1.4–7.0)
Neutrophils: 69 %
Platelets: 257 10*3/uL (ref 150–450)
RBC: 5.53 x10E6/uL — ABNORMAL HIGH (ref 3.77–5.28)
RDW: 13 % (ref 11.7–15.4)
WBC: 6.8 10*3/uL (ref 3.4–10.8)

## 2021-10-20 LAB — HEMOGLOBIN A1C
Est. average glucose Bld gHb Est-mCnc: 111 mg/dL
Hgb A1c MFr Bld: 5.5 % (ref 4.8–5.6)

## 2021-10-20 LAB — COMPREHENSIVE METABOLIC PANEL
ALT: 31 IU/L (ref 0–32)
AST: 20 IU/L (ref 0–40)
Albumin/Globulin Ratio: 2.8 — ABNORMAL HIGH (ref 1.2–2.2)
Albumin: 4.7 g/dL (ref 3.8–4.8)
Alkaline Phosphatase: 92 IU/L (ref 44–121)
BUN/Creatinine Ratio: 23 (ref 12–28)
BUN: 14 mg/dL (ref 8–27)
Bilirubin Total: 0.5 mg/dL (ref 0.0–1.2)
CO2: 22 mmol/L (ref 20–29)
Calcium: 9.3 mg/dL (ref 8.7–10.3)
Chloride: 103 mmol/L (ref 96–106)
Creatinine, Ser: 0.61 mg/dL (ref 0.57–1.00)
Globulin, Total: 1.7 g/dL (ref 1.5–4.5)
Glucose: 95 mg/dL (ref 70–99)
Potassium: 4.6 mmol/L (ref 3.5–5.2)
Sodium: 141 mmol/L (ref 134–144)
Total Protein: 6.4 g/dL (ref 6.0–8.5)
eGFR: 100 mL/min/{1.73_m2} (ref >59)

## 2021-10-20 LAB — LIPID PANEL
Chol/HDL Ratio: 3.1 ratio (ref 0.0–4.4)
Cholesterol, Total: 173 mg/dL (ref 100–199)
HDL: 56 mg/dL (ref >39)
LDL Chol Calc (NIH): 103 mg/dL — ABNORMAL HIGH (ref 0–99)
Triglycerides: 76 mg/dL (ref 0–149)
VLDL Cholesterol Cal: 14 mg/dL (ref 5–40)

## 2021-10-20 LAB — TSH: TSH: 0.557 u[IU]/mL (ref 0.450–4.500)

## 2021-10-26 ENCOUNTER — Encounter: Payer: Self-pay | Admitting: Nurse Practitioner

## 2021-10-26 ENCOUNTER — Ambulatory Visit (INDEPENDENT_AMBULATORY_CARE_PROVIDER_SITE_OTHER): Payer: Medicare Other | Admitting: Nurse Practitioner

## 2021-10-26 VITALS — BP 138/82 | Ht <= 58 in | Wt 171.2 lb

## 2021-10-26 DIAGNOSIS — M810 Age-related osteoporosis without current pathological fracture: Secondary | ICD-10-CM | POA: Diagnosis not present

## 2021-10-26 DIAGNOSIS — Z01419 Encounter for gynecological examination (general) (routine) without abnormal findings: Secondary | ICD-10-CM

## 2021-10-26 DIAGNOSIS — Z0000 Encounter for general adult medical examination without abnormal findings: Secondary | ICD-10-CM | POA: Diagnosis not present

## 2021-10-26 DIAGNOSIS — E039 Hypothyroidism, unspecified: Secondary | ICD-10-CM

## 2021-11-08 ENCOUNTER — Ambulatory Visit (HOSPITAL_COMMUNITY)
Admission: RE | Admit: 2021-11-08 | Discharge: 2021-11-08 | Disposition: A | Discharge status: Home / Self Care | Payer: Medicare Other | Source: Ambulatory Visit | Attending: Nurse Practitioner | Admitting: Nurse Practitioner

## 2021-11-08 DIAGNOSIS — M810 Age-related osteoporosis without current pathological fracture: Secondary | ICD-10-CM | POA: Insufficient documentation

## 2021-11-09 ENCOUNTER — Other Ambulatory Visit: Payer: Self-pay | Admitting: Nurse Practitioner

## 2021-11-09 ENCOUNTER — Encounter: Payer: Self-pay | Admitting: Nurse Practitioner

## 2021-11-09 DIAGNOSIS — M810 Age-related osteoporosis without current pathological fracture: Secondary | ICD-10-CM

## 2021-11-12 ENCOUNTER — Other Ambulatory Visit (HOSPITAL_COMMUNITY): Payer: Self-pay | Admitting: Nurse Practitioner

## 2021-11-12 DIAGNOSIS — Z1231 Encounter for screening mammogram for malignant neoplasm of breast: Secondary | ICD-10-CM

## 2021-12-06 ENCOUNTER — Encounter: Payer: Self-pay | Admitting: "Endocrinology

## 2021-12-06 ENCOUNTER — Ambulatory Visit (INDEPENDENT_AMBULATORY_CARE_PROVIDER_SITE_OTHER): Payer: Medicare Other | Admitting: "Endocrinology

## 2021-12-06 VITALS — BP 110/78 | HR 68 | Ht <= 58 in | Wt 173.8 lb

## 2021-12-06 DIAGNOSIS — E039 Hypothyroidism, unspecified: Secondary | ICD-10-CM

## 2021-12-06 DIAGNOSIS — E782 Mixed hyperlipidemia: Secondary | ICD-10-CM | POA: Insufficient documentation

## 2021-12-06 DIAGNOSIS — M810 Age-related osteoporosis without current pathological fracture: Secondary | ICD-10-CM | POA: Diagnosis not present

## 2021-12-06 DIAGNOSIS — Z6836 Body mass index (BMI) 36.0-36.9, adult: Secondary | ICD-10-CM

## 2021-12-06 NOTE — Progress Notes (Signed)
12/06/2021      Endocrinology Consult Note  Past Medical History:  Diagnosis Date   Hypothyroid    Osteoporosis    Past Surgical History:  Procedure Laterality Date   BIOPSY  01/02/2021   Procedure: BIOPSY;  Surgeon: Eloise Harman, DO;  Location: [facility];  Service: Endoscopy;;   CHOLECYSTECTOMY  1999   COLONOSCOPY N/A 06/26/2017   recto-sigmoid colon and sigmoid colon moderately redundant, multiple small and large-mouthed diverticula in recto-sigmoid, sigmoid, descending colon and splenic flexure.   ESOPHAGOGASTRODUODENOSCOPY (EGD) WITH PROPOFOL N/A 01/02/2021   small hiatal hernia, gastritis s/p biopsy (negative H.pylori, benign path), normal duodenum   TONSILECTOMY, ADENOIDECTOMY, BILATERAL MYRINGOTOMY AND TUBES  1962   Social History   Socioeconomic History   Marital status: Married    Spouse name: Not on file   Number of children: Not on file   Years of education: Not on file   Highest education level: Not on file  Occupational History   Not on file  Tobacco Use   Smoking status: Never   Smokeless tobacco: Never  Vaping Use   Vaping Use: Never used  Substance and Sexual Activity   Alcohol use: No   Drug use: No   Sexual activity: Yes  Other Topics Concern   Not on file  Social History Narrative   Not on file   Social Determinants of Health   Financial Resource Strain: Not on file  Food Insecurity: Not on file  Transportation Needs: Not on file  Physical Activity: Not on file  Stress: Not on file  Social Connections: Not on file   Outpatient Encounter Medications as of 12/06/2021  Medication Sig   alendronate (FOSAMAX) 70 MG tablet TAKE 1 TABLET BY MOUTH EVERY 7 DAYS. TAKE WITH A FULL GLASS OF WATER ON AN EMPTY STOMACH   calcium carbonate (OS-CAL) 600 MG TABS Take 1,200 mg by mouth daily with breakfast.   cholecalciferol (VITAMIN D) 25 MCG (1000 UNIT) tablet Take 2,000 Units by mouth  daily.   ibuprofen (ADVIL,MOTRIN) 200 MG tablet Take 400-600 mg by mouth every 8 (eight) hours as needed for headache or mild pain.   levothyroxine (SYNTHROID) 125 MCG tablet Take 1 tablet (125 mcg total) by mouth daily before breakfast. TAKE 1 TABLET DAILY.   Magnesium Oxide 200 MG TABS Take 200 mg by mouth daily.   Misc Natural Products (GLUCOSAMINE CHOND MSM FORMULA PO) Take 1 tablet by mouth daily.   Multiple Vitamins-Minerals (PRESERVISION AREDS 2+MULTI VIT PO) Take 1 capsule by mouth daily.   ondansetron (ZOFRAN ODT) 4 MG disintegrating tablet Take 1 tablet (4 mg total) by mouth every 8 (eight) hours as needed for nausea or vomiting.   Probiotic Product (PHILLIPS COLON HEALTH PO) Take 1 capsule by mouth daily.   triamcinolone (NASACORT) 55 MCG/ACT AERO nasal inhaler Place 2 sprays into the nose daily.   vitamin C (ASCORBIC ACID) 500 MG tablet Take 500 mg by mouth daily.   No facility-administered encounter medications on file as of 12/06/2021.   ALLERGIES: No Known Allergies  VACCINATION STATUS: Immunization History  Administered Date(s) Administered   Influenza,inj,Quad PF,6+ Mos 02/25/2017   Influenza-Unspecified  02/03/2018, 02/02/2019   Janssen (J&J) SARS-COV-2 Vaccination 09/20/2019, 10/18/2019   Pneumococcal Polysaccharide-23 10/13/2020   Zoster Recombinat (Shingrix) 01/05/2019, 02/04/2019     HPI   Sandra Zavala is 66 y.o. female who presents today with a medical history as above. she is being seen in consultation for acute process requested by Nilda Simmer, NP.  Patient was diagnosed with osteoporosis  approximately  10 years ago. She denies fractures or falls. No dizziness/vertigo/orthostasis.  I reviewed pt's DEXA scans: Progressively she has improved her bone density utilizing alendronate 70 mg p.o. weekly.  Also on regular dose of vitamin D 2000 units daily as well as calcium 1200 mg p.o. daily. She has lost some inches in height  She also eats dairy and  green, leafy, vegetables.   No weight bearing exercises.  She denies renal, hepatic, cardiac problems.  She has hypothyroidism on levothyroxine 125 mcg p.o. daily.  She also has hyperlipidemia not on treatment.   No h/o hyper/hypocalcemia. No h/o hyperparathyroidism. No h/o kidney stones. Lab Results  Component Value Date   CALCIUM 9.3 10/19/2021   CALCIUM 8.7 10/06/2020   CALCIUM 9.3 10/06/2019   CALCIUM 9.2 06/10/2018   CALCIUM 9.3 06/18/2017   CALCIUM 9.7 06/13/2016   CALCIUM 8.9 06/16/2015   CALCIUM 9.0 12/23/2013   CALCIUM 9.5 12/03/2012    No h/o thyrotoxicosis. Reviewed TSH recent levels:  Lab Results  Component Value Date   TSH 0.557 10/19/2021   TSH 1.490 10/06/2020   TSH 1.800 10/06/2019   TSH 1.290 06/10/2018   TSH 1.190 06/18/2017    No h/o CKD. Last BUN/Cr: Lab Results  Component Value Date   BUN 14 10/19/2021   CREATININE 0.61 10/19/2021   Pt does have a FH of osteoporosis in her mother.   Review of Systems  Constitutional: no weight gain/loss, no fatigue, no subjective hyperthermia, no subjective hypothermia Eyes: no blurry vision, no xerophthalmia ENT: no sore throat, no nodules palpated in throat, no dysphagia/odynophagia, no hoarseness Cardiovascular: no Chest Pain, no Shortness of Breath, no palpitations, no leg swelling Respiratory: no cough, no SOB Gastrointestinal: no Nausea/Vomiting/Diarhhea Musculoskeletal: no muscle/joint aches Skin: no rashes Neurological: no tremors, no numbness, no tingling, no dizziness Psychiatric: no depression, no anxiety  Objective:    BP 110/78   Pulse 68   Ht '4\' 10"'$  (1.473 m)   Wt 173 lb 12.8 oz (78.8 kg)   BMI 36.32 kg/m   Wt Readings from Last 3 Encounters:  12/06/21 173 lb 12.8 oz (78.8 kg)  10/26/21 171 lb 3.2 oz (77.7 kg)  09/17/21 171 lb 12.8 oz (77.9 kg)    Physical Exam  Constitutional: BMI of 36.3, not in acute distress, normal state of mind Eyes: PERRLA, EOMI, no exophthalmos ENT: moist  mucous membranes, no thyromegaly, no cervical lymphadenopathy Cardiovascular: normal precordial activity, Regular Rate and Rhythm, no Murmur/Rubs/Gallops Respiratory:  adequate breathing efforts, no gross chest deformity, Clear to auscultation bilaterally Gastrointestinal: abdomen soft, Non -tender, No distension, Bowel Sounds present Musculoskeletal: no gross deformities, strength intact in all four extremities Skin: moist, warm, no rashes Neurological: no tremor with outstretched hands, Deep tendon reflexes normal in all four extremities.  CMP ( most recent) CMP     Component Value Date/Time   NA 141 10/19/2021 0806   K 4.6 10/19/2021 0806   CL 103 10/19/2021 0806   CO2 22 10/19/2021 0806   GLUCOSE 95 10/19/2021 0806   GLUCOSE 95 06/16/2015 0819   BUN 14 10/19/2021 0806  CREATININE 0.61 10/19/2021 0806   CREATININE 0.55 06/16/2015 0819   CALCIUM 9.3 10/19/2021 0806   PROT 6.4 10/19/2021 0806   ALBUMIN 4.7 10/19/2021 0806   AST 20 10/19/2021 0806   ALT 31 10/19/2021 0806   ALKPHOS 92 10/19/2021 0806   BILITOT 0.5 10/19/2021 0806   GFRNONAA 95 10/06/2019 0803   GFRAA 110 10/06/2019 0803     Diabetic Labs (most recent): Lab Results  Component Value Date   HGBA1C 5.5 10/19/2021   HGBA1C 5.5 10/06/2020   HGBA1C 5.4 10/06/2019     Lipid Panel ( most recent) Lipid Panel     Component Value Date/Time   CHOL 173 10/19/2021 0806   TRIG 76 10/19/2021 0806   HDL 56 10/19/2021 0806   CHOLHDL 3.1 10/19/2021 0806   CHOLHDL 3.3 06/16/2015 0819   VLDL 13 06/16/2015 0819   LDLCALC 103 (H) 10/19/2021 0806   LABVLDL 14 10/19/2021 0806      Lab Results  Component Value Date   TSH 0.557 10/19/2021   TSH 1.490 10/06/2020   TSH 1.800 10/06/2019   TSH 1.290 06/10/2018   TSH 1.190 06/18/2017   TSH 2.430 06/13/2016   TSH 1.070 08/17/2015   TSH 1.087 12/23/2013   TSH 2.995 12/03/2012     Bone density studies between 2013-2023 are reviewed as follows: [facility] Spine L1-L2  11/08/2021 65.0 Osteoporosis -2.5 0.863 g/cm2 3.9% - [facility] Spine L1-L2 11/01/2019 63.0 Osteoporosis -2.8 0.831 g/cm2 -1.0% - [facility] Spine L1-L2 06/20/2016 59.6 Osteoporosis -2.7 0.839 g/cm2 1.6% - [facility] Spine L1-L2 12/28/2013 57.1 Osteoporosis -2.8 0.826 g/cm2 2.4% - [facility] Spine L1-L2 06/27/2011 54.6 Osteoporosis -3.0 0.807 g/cm2 - -   DualFemur Neck Left 11/08/2021 65.0 Osteopenia -1.8 0.787 g/cm2 5.4% Yes DualFemur Neck Left 11/01/2019 63.0 Osteopenia -2.1 0.747 g/cm2 -3.9% - DualFemur Neck Left 06/20/2016 59.6 Osteopenia -1.9 0.777 g/cm2 1.8% - DualFemur Neck Left 12/28/2013 57.1 Osteopenia -2.0 0.763 g/cm2 -3.0% - DualFemur Neck Left 06/27/2011 54.6 Osteopenia -1.8 0.787 g/cm2 - -  Assessment: 1. Osteoporosis  Plan: 1. 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  I explained that based on the T scores, she has an increased risk for fractures.  She has benefited from Alendronic intervention over the years. Safety data for up to 10 years of treatment with this medication. She would benefit from staying on this medication until her next bone density.  If she maintains gains, she will be considered for drug holiday.  If she starts to lose bone mass, she will be considered for her next option of treatment either with Prolia or Forteo.  - We discussed about the different medication classes, benefits and side effects (including atypical fractures and ONJ - no dental workup in progress or planned).  - we reviewed her dietary and supplemental calcium and vitamin D intake, which I believe are adequate. Pt ends up getting at least 2000 units vitamin D at least 1200 mg of calcium daily. I advised her to continue this - given her specific instructions about food sources for these - see pt instructions  - discussed fall precautions  .  Also, avoid smoking or >2 drinks of alcohol a  day. -At her next visit, will be checking CMP, lipid panel, TSH/free T4, and vitamin D   -Her next bone density was in July 2025. In light of her hyperlipidemia metabolic syndrome, and the fact that she would  like to avoid statin intervention, she was given a packet of lifestyle medicine.  - she acknowledges that there is a room for improvement in her food and drink choices. - Suggestion is made for her to avoid simple carbohydrates  from her diet including Cakes, Sweet Desserts, Ice Cream, Soda (diet and regular), Sweet Tea, Candies, Chips, Cookies, Store Bought Juices, Alcohol , Artificial Sweeteners,  Coffee Creamer, and "Sugar-free" Products, Lemonade. This will help patient to have more stable blood glucose profile and potentially avoid unintended weight gain.  The following Lifestyle Medicine recommendations according to [facility]) were discussed and and offered to patient and she  agrees to start the journey:  A. Whole Foods, Plant-Based Nutrition comprising of fruits and vegetables, plant-based proteins, whole-grain carbohydrates was discussed in detail with the patient.   A list for source of those nutrients were also provided to the patient.  Patient will use only water or unsweetened tea for hydration. B.  The need to stay away from risky substances including alcohol, smoking; obtaining 7 to 9 hours of restorative sleep, at least 150 minutes of moderate intensity exercise weekly, the importance of healthy social connections,  and stress management techniques were discussed. C.  A full color page of  Calorie density of various food groups per pound showing examples of each food groups was provided to the patient.  - I advised patient to maintain close follow up with Nilda Simmer, NP for primary care needs.  - Time spent with the patient: 45 minutes, of which >50% was spent in obtaining information about her symptoms, reviewing her previous labs,  evaluations, and treatments, counseling her about her 2.6, hyperlipidemia, and developing a plan to confirm the diagnosis and long term treatment as necessary.  Molli Hazard participated in the discussions, expressed understanding, and voiced agreement with the above plans.  All questions were answered to her satisfaction. she is encouraged to contact clinic should she have any questions or concerns prior to her return visit.  Follow up plan: Return in about 6 months (around 06/08/2022) for Fasting Labs  in AM B4 8.   Glade Lloyd, MD [facility] [address] [place], [place] [address] Phone: [phone]  Fax: [phone]     12/06/2021, 1:02 PM  This note was partially dictated with voice recognition software. Similar sounding words can be transcribed inadequately or may not  be corrected upon review.

## 2021-12-07 DIAGNOSIS — E66812 Obesity, class 2: Secondary | ICD-10-CM | POA: Insufficient documentation

## 2021-12-09 ENCOUNTER — Other Ambulatory Visit: Payer: Self-pay | Admitting: Nurse Practitioner

## 2021-12-21 ENCOUNTER — Ambulatory Visit (HOSPITAL_COMMUNITY)
Admission: RE | Admit: 2021-12-21 | Discharge: 2021-12-21 | Disposition: A | Discharge status: Home / Self Care | Payer: Medicare Other | Source: Ambulatory Visit | Attending: Nurse Practitioner | Admitting: Nurse Practitioner

## 2021-12-21 DIAGNOSIS — Z1231 Encounter for screening mammogram for malignant neoplasm of breast: Secondary | ICD-10-CM | POA: Insufficient documentation

## 2022-03-15 ENCOUNTER — Other Ambulatory Visit: Payer: Self-pay | Admitting: Family Medicine

## 2022-05-01 ENCOUNTER — Ambulatory Visit (INDEPENDENT_AMBULATORY_CARE_PROVIDER_SITE_OTHER): Payer: Medicare Other | Admitting: Family Medicine

## 2022-05-01 VITALS — BP 112/68 | HR 96 | Temp 97.3°F | Wt 175.0 lb

## 2022-05-01 DIAGNOSIS — N644 Mastodynia: Secondary | ICD-10-CM | POA: Diagnosis not present

## 2022-05-01 NOTE — Progress Notes (Signed)
Subjective:  Patient ID: Sandra Zavala, female    DOB: 03/29/1956  Age: 66 y.o. MRN: [id number]  CC: Chief Complaint  Patient presents with   pain in left breast    X 1 week Hx of fibrocystic dis.     HPI:  66 year old female presents for evaluation of the above.  Patient has a history of fibrocystic changes/abnormal glandular density.  She reports that she has had intermittent left upper breast pain over the past week.  No appreciable mass.  No nipple discharge.  No breast erythema.  Patient had a BI-RADS Category 1 mammogram in August.  Given the persistence of her discomfort, she decided to come in for evaluation.  Patient Active Problem List   Diagnosis Date Noted   Breast pain, left 05/01/2022   Class 2 severe obesity due to excess calories with serious comorbidity and body mass index (BMI) of 36.0 to 36.9 in adult [facility]) 12/07/2021   Mixed hyperlipidemia 12/06/2021   Hypothyroidism 06/16/2017   Osteoporosis 12/05/2012    Social Hx   Social History   Socioeconomic History   Marital status: Married    Spouse name: Not on file   Number of children: Not on file   Years of education: Not on file   Highest education level: Not on file  Occupational History   Not on file  Tobacco Use   Smoking status: Never   Smokeless tobacco: Never  Vaping Use   Vaping Use: Never used  Substance and Sexual Activity   Alcohol use: No   Drug use: No   Sexual activity: Yes  Other Topics Concern   Not on file  Social History Narrative   Not on file   Social Determinants of Health   Financial Resource Strain: Not on file  Food Insecurity: Not on file  Transportation Needs: Not on file  Physical Activity: Not on file  Stress: Not on file  Social Connections: Not on file    Review of Systems Per HPI  Objective:  BP 112/68   Pulse 96   Temp (!) 97.3 F (36.3 C)   Wt 175 lb (79.4 kg)   SpO2 99%   BMI 36.58 kg/m      05/01/2022    1:28 PM 12/06/2021    8:25 AM  10/26/2021   10:03 AM  BP/Weight  Systolic BP 546 568 127  Diastolic BP 68 78 82  Wt. (Lbs) 175 173.8 171.2  BMI 36.58 kg/m2 36.32 kg/m2 35.78 kg/m2    Physical Exam Vitals and nursing note reviewed. Exam conducted with a chaperone present.  Constitutional:      General: She is not in acute distress.    Appearance: Normal appearance.  Cardiovascular:     Rate and Rhythm: Normal rate and regular rhythm.  Pulmonary:     Effort: Pulmonary effort is normal.     Breath sounds: Normal breath sounds. No wheezing, rhonchi or rales.  Chest:     Comments: Breast exam: Left breast -inverted nipple noted.  Patient states that this is a chronic finding.  No nipple discharge.  No appreciable mass.  No breast erythema. Neurological:     Mental Status: She is alert.     Lab Results  Component Value Date   WBC 6.8 10/19/2021   HGB 15.6 10/19/2021   HCT 46.8 (H) 10/19/2021   PLT 257 10/19/2021   GLUCOSE 95 10/19/2021   CHOL 173 10/19/2021   TRIG 76 10/19/2021   HDL 56 10/19/2021  LDLCALC 103 (H) 10/19/2021   ALT 31 10/19/2021   AST 20 10/19/2021   NA 141 10/19/2021   K 4.6 10/19/2021   CL 103 10/19/2021   CREATININE 0.61 10/19/2021   BUN 14 10/19/2021   CO2 22 10/19/2021   TSH 0.557 10/19/2021   HGBA1C 5.5 10/19/2021     Assessment & Plan:   Problem List Items Addressed This Visit       Other   Breast pain, left - Primary    Exam benign.  Patient has had a recent mammogram.  Advised watchful waiting and over-the-counter NSAID.  If continues to persist we will proceed with ultrasound imaging.  Patient is in agreement with the plan.      Follow-up:  Return if symptoms worsen or fail to improve.  [place]

## 2022-05-01 NOTE — Assessment & Plan Note (Signed)
Exam benign.  Patient has had a recent mammogram.  Advised watchful waiting and over-the-counter NSAID.  If continues to persist we will proceed with ultrasound imaging.  Patient is in agreement with the plan.

## 2022-05-01 NOTE — Patient Instructions (Signed)
Nothing to be worried about based on exam.  If this continues to persist, I will order [place].  Take care  Dr. Lacinda Axon

## 2022-06-05 LAB — COMPREHENSIVE METABOLIC PANEL
ALT: 31 IU/L (ref 0–32)
AST: 24 IU/L (ref 0–40)
Albumin/Globulin Ratio: 2.8 — ABNORMAL HIGH (ref 1.2–2.2)
Albumin: 4.7 g/dL (ref 3.9–4.9)
Alkaline Phosphatase: 81 IU/L (ref 44–121)
BUN/Creatinine Ratio: 20 (ref 12–28)
BUN: 12 mg/dL (ref 8–27)
Bilirubin Total: 0.3 mg/dL (ref 0.0–1.2)
CO2: 21 mmol/L (ref 20–29)
Calcium: 9.4 mg/dL (ref 8.7–10.3)
Chloride: 106 mmol/L (ref 96–106)
Creatinine, Ser: 0.6 mg/dL (ref 0.57–1.00)
Globulin, Total: 1.7 g/dL (ref 1.5–4.5)
Glucose: 101 mg/dL — ABNORMAL HIGH (ref 70–99)
Potassium: 4.5 mmol/L (ref 3.5–5.2)
Sodium: 144 mmol/L (ref 134–144)
Total Protein: 6.4 g/dL (ref 6.0–8.5)
eGFR: 100 mL/min/{1.73_m2} (ref >59)

## 2022-06-05 LAB — LIPID PANEL
Chol/HDL Ratio: 3 ratio (ref 0.0–4.4)
Cholesterol, Total: 179 mg/dL (ref 100–199)
HDL: 59 mg/dL (ref >39)
LDL Chol Calc (NIH): 109 mg/dL — ABNORMAL HIGH (ref 0–99)
Triglycerides: 59 mg/dL (ref 0–149)
VLDL Cholesterol Cal: 11 mg/dL (ref 5–40)

## 2022-06-05 LAB — TSH: TSH: 0.392 u[IU]/mL — ABNORMAL LOW (ref 0.450–4.500)

## 2022-06-05 LAB — T4, FREE: Free T4: 1.58 ng/dL (ref 0.82–1.77)

## 2022-06-05 LAB — VITAMIN D 25 HYDROXY (VIT D DEFICIENCY, FRACTURES): Vit D, 25-Hydroxy: 85.3 ng/mL (ref 30.0–100.0)

## 2022-06-10 ENCOUNTER — Ambulatory Visit: Payer: Medicare Other | Admitting: "Endocrinology

## 2022-06-11 ENCOUNTER — Encounter: Payer: Self-pay | Admitting: "Endocrinology

## 2022-06-11 ENCOUNTER — Ambulatory Visit (INDEPENDENT_AMBULATORY_CARE_PROVIDER_SITE_OTHER): Payer: Medicare Other | Admitting: "Endocrinology

## 2022-06-11 VITALS — BP 124/76 | HR 68 | Ht <= 58 in | Wt 170.0 lb

## 2022-06-11 DIAGNOSIS — E782 Mixed hyperlipidemia: Secondary | ICD-10-CM | POA: Diagnosis not present

## 2022-06-11 DIAGNOSIS — E039 Hypothyroidism, unspecified: Secondary | ICD-10-CM

## 2022-06-11 DIAGNOSIS — M810 Age-related osteoporosis without current pathological fracture: Secondary | ICD-10-CM

## 2022-06-11 MED ORDER — ALENDRONATE SODIUM 70 MG PO TABS: ORAL_TABLET | ORAL | 3 refills | Status: DC

## 2022-06-11 MED ORDER — LEVOTHYROXINE SODIUM 112 MCG PO TABS: 112.0000 ug | ORAL_TABLET | Freq: Every day | ORAL | 1 refills | Status: DC

## 2022-06-11 NOTE — Progress Notes (Signed)
Endocrinology follow-up note                                                           06/11/2022       Past Medical History:  Diagnosis Date   Hypothyroid    Osteoporosis    Past Surgical History:  Procedure Laterality Date   BIOPSY  01/02/2021   Procedure: BIOPSY;  Surgeon: Eloise Harman, DO;  Location: [facility];  Service: Endoscopy;;   CHOLECYSTECTOMY  1999   COLONOSCOPY N/A 06/26/2017   recto-sigmoid colon and sigmoid colon moderately redundant, multiple small and large-mouthed diverticula in recto-sigmoid, sigmoid, descending colon and splenic flexure.   ESOPHAGOGASTRODUODENOSCOPY (EGD) WITH PROPOFOL N/A 01/02/2021   small hiatal hernia, gastritis s/p biopsy (negative H.pylori, benign path), normal duodenum   TONSILECTOMY, ADENOIDECTOMY, BILATERAL MYRINGOTOMY AND TUBES  1962   Social History   Socioeconomic History   Marital status: Married    Spouse name: Not on file   Number of children: Not on file   Years of education: Not on file   Highest education level: Not on file  Occupational History   Not on file  Tobacco Use   Smoking status: Never   Smokeless tobacco: Never  Vaping Use   Vaping Use: Never used  Substance and Sexual Activity   Alcohol use: No   Drug use: No   Sexual activity: Yes  Other Topics Concern   Not on file  Social History Narrative   Not on file   Social Determinants of Health   Financial Resource Strain: Not on file  Food Insecurity: Not on file  Transportation Needs: Not on file  Physical Activity: Not on file  Stress: Not on file  Social Connections: Not on file   Outpatient Encounter Medications as of 06/11/2022  Medication Sig   [place] (FIBER CHOICE PO) Take 3 tablets by mouth daily.   alendronate (FOSAMAX) 70 MG tablet TAKE 1 TABLET BY MOUTH EVERY 7 DAYS. TAKE WITH A FULL GLASS OF WATER ON AN EMPTY STOMACH   calcium carbonate (OS-CAL) 600 MG TABS Take 1,200 mg by mouth daily with breakfast.   cholecalciferol  (VITAMIN D) 25 MCG (1000 UNIT) tablet Take 2,000 Units by mouth daily.   ibuprofen (ADVIL,MOTRIN) 200 MG tablet Take 400-600 mg by mouth every 8 (eight) hours as needed for headache or mild pain.   levothyroxine (SYNTHROID) 112 MCG tablet Take 1 tablet (112 mcg total) by mouth daily before breakfast. TAKE 1 TABLET DAILY.   Magnesium Oxide 200 MG TABS Take 200 mg by mouth daily.   Misc Natural Products (GLUCOSAMINE CHOND MSM FORMULA PO) Take 1 tablet by mouth daily.   ondansetron (ZOFRAN ODT) 4 MG disintegrating tablet Take 1 tablet (4 mg total) by mouth every 8 (eight) hours as needed for nausea or vomiting.   Probiotic Product (PHILLIPS COLON HEALTH PO) Take 1 capsule by mouth daily.   triamcinolone (NASACORT) 55 MCG/ACT AERO nasal inhaler Place 2 sprays into the nose daily.   vitamin C (ASCORBIC ACID) 500 MG tablet Take 500 mg by mouth daily.   [DISCONTINUED] alendronate (FOSAMAX) 70 MG tablet TAKE 1 TABLET BY MOUTH EVERY 7 DAYS. TAKE WITH A FULL GLASS OF WATER ON AN EMPTY STOMACH   [DISCONTINUED] levothyroxine (SYNTHROID) 125 MCG tablet TAKE  1 TABLET (125 MCG TOTAL) BY MOUTH DAILY BEFORE BREAKFAST. TAKE 1 TABLET DAILY.   [DISCONTINUED] Multiple Vitamins-Minerals (PRESERVISION AREDS 2+MULTI VIT PO) Take 1 capsule by mouth daily.   No facility-administered encounter medications on file as of 06/11/2022.   ALLERGIES: No Known Allergies  VACCINATION STATUS: Immunization History  Administered Date(s) Administered   Fluad Quad(high Dose 65+) 02/25/2022   Influenza,inj,Quad PF,6+ Mos 02/25/2017   Influenza-Unspecified 02/03/2018, 02/02/2019   Janssen (J&J) SARS-COV-2 Vaccination 09/20/2019, 10/18/2019   Pneumococcal Polysaccharide-23 10/13/2020   Zoster Recombinat (Shingrix) 01/05/2019, 02/04/2019     HPI   Sandra Zavala is 67 y.o. female who presents today for follow-up after she was seen in consultation for osteoporosis requested by  Coral Spikes, DO.  Patient was diagnosed with  osteoporosis  approximately  10 years ago. She denies fractures or falls. No dizziness/vertigo/orthostasis.  I reviewed pt's DEXA scans: Progressively she has improved her bone density utilizing alendronate 70 mg p.o. weekly.  Also on regular dose of vitamin D 2000 units daily as well as calcium 1200 mg p.o. daily. She has lost some inches in height.  Her last bone density was in July 2023.  She has no interval falls nor fractures.  She also eats dairy and green, leafy, vegetables.   No weight bearing exercises.  She denies renal, hepatic, cardiac problems.  She has hypothyroidism on levothyroxine 125 mcg p.o. daily.  She also has hyperlipidemia not on treatment.  No h/o hyper/hypocalcemia. No h/o hyperparathyroidism. No h/o kidney stones.  No h/o CKD. Last BUN/Cr:  Pt does have a FH of osteoporosis in her mother.   Review of Systems  Constitutional: no weight gain/loss, no fatigue, no subjective hyperthermia, no subjective hypothermia   Objective:    BP 124/76   Pulse 68   Ht '4\' 10"'$  (1.473 m)   Wt 170 lb (77.1 kg)   BMI 35.53 kg/m   Wt Readings from Last 3 Encounters:  06/11/22 170 lb (77.1 kg)  05/01/22 175 lb (79.4 kg)  12/06/21 173 lb 12.8 oz (78.8 kg)    Physical Exam  Constitutional: BMI of 36.3, not in acute distress, normal state of mind Eyes: PERRLA, EOMI, no exophthalmos ENT: moist mucous membranes, no thyromegaly, no cervical lymphadenopathy Cardiovascular: normal precordial activity, Regular Rate and Rhythm, no Murmur/Rubs/Gallops   CMP ( most recent) CMP     Component Value Date/Time   NA 144 06/04/2022 0802   K 4.5 06/04/2022 0802   CL 106 06/04/2022 0802   CO2 21 06/04/2022 0802   GLUCOSE 101 (H) 06/04/2022 0802   GLUCOSE 95 06/16/2015 0819   BUN 12 06/04/2022 0802   CREATININE 0.60 06/04/2022 0802   CREATININE 0.55 06/16/2015 0819   CALCIUM 9.4 06/04/2022 0802   PROT 6.4 06/04/2022 0802   ALBUMIN 4.7 06/04/2022 0802   AST 24 06/04/2022 0802    ALT 31 06/04/2022 0802   ALKPHOS 81 06/04/2022 0802   BILITOT 0.3 06/04/2022 0802   GFRNONAA 95 10/06/2019 0803   GFRAA 110 10/06/2019 0803    Diabetic Labs (most recent): Lab Results  Component Value Date   HGBA1C 5.5 10/19/2021   HGBA1C 5.5 10/06/2020   HGBA1C 5.4 10/06/2019     Lipid Panel ( most recent) Lipid Panel     Component Value Date/Time   CHOL 179 06/04/2022 0802   TRIG 59 06/04/2022 0802   HDL 59 06/04/2022 0802   CHOLHDL 3.0 06/04/2022 0802   CHOLHDL 3.3 06/16/2015 0819   VLDL  13 06/16/2015 0819   LDLCALC 109 (H) 06/04/2022 0802   LABVLDL 11 06/04/2022 0802      Lab Results  Component Value Date   TSH 0.392 (L) 06/04/2022   TSH 0.557 10/19/2021   TSH 1.490 10/06/2020   TSH 1.800 10/06/2019   TSH 1.290 06/10/2018   TSH 1.190 06/18/2017   TSH 2.430 06/13/2016   TSH 1.070 08/17/2015   TSH 1.087 12/23/2013   TSH 2.995 12/03/2012   FREET4 1.58 06/04/2022     Bone density studies between 2013-2023 are reviewed as follows: [facility] Spine L1-L2 11/08/2021 65.0 Osteoporosis -2.5 0.863 g/cm2 3.9% - [facility] Spine L1-L2 11/01/2019 63.0 Osteoporosis -2.8 0.831 g/cm2 -1.0% - [facility] Spine L1-L2 06/20/2016 59.6 Osteoporosis -2.7 0.839 g/cm2 1.6% - [facility] Spine L1-L2 12/28/2013 57.1 Osteoporosis -2.8 0.826 g/cm2 2.4% - [facility] Spine L1-L2 06/27/2011 54.6 Osteoporosis -3.0 0.807 g/cm2 - -   DualFemur Neck Left 11/08/2021 65.0 Osteopenia -1.8 0.787 g/cm2 5.4% Yes DualFemur Neck Left 11/01/2019 63.0 Osteopenia -2.1 0.747 g/cm2 -3.9% - DualFemur Neck Left 06/20/2016 59.6 Osteopenia -1.9 0.777 g/cm2 1.8% - DualFemur Neck Left 12/28/2013 57.1 Osteopenia -2.0 0.763 g/cm2 -3.0% - DualFemur Neck Left 06/27/2011 54.6 Osteopenia -1.8 0.787 g/cm2 - -  Assessment: 1. Osteoporosis 2.  Hypocalcemia 3.  Hyperlipidemia 4.  Hypothyroidism  Plan: 1. 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  I explained that based on the T scores, she has an increased risk for fractures.  She has benefited from Alendronic intervention over the years. Safety data for up to 10 years of treatment with this medication. She would benefit from staying on this medication until her next bone density.  If she maintains gains, she will be considered for drug holiday.  If she starts to lose bone mass, she will be considered for her next option of treatment either with Prolia or Forteo.  - We discussed about the different medication classes, benefits and side effects (including atypical fractures and ONJ - no dental workup in progress or planned).  - we reviewed her dietary and supplemental calcium and vitamin D intake, which I believe are adequate. Pt ends up getting at least 2000 units vitamin D at least 1200 mg of calcium daily. I advised her to continue this - given her specific instructions about food sources for these - see pt instructions  - discussed fall precautions  .  Also, avoid smoking or >2 drinks of alcohol a day.  -Her next bone density was in July 2025. In light of her hyperlipidemia,  metabolic syndrome, and the fact that she would like to avoid statin intervention, she was given a packet of lifestyle medicine. - she acknowledges that there is a room for improvement in her food and drink choices. - Suggestion is made for her to avoid simple carbohydrates  from her diet including Cakes, Sweet Desserts, Ice Cream, Soda (diet and regular), Sweet Tea, Candies, Chips, Cookies, Store Bought Juices, Alcohol , Artificial Sweeteners,  Coffee Creamer, and "Sugar-free" Products, Lemonade. This will help patient to have more stable blood glucose profile and potentially avoid unintended weight gain.  The following Lifestyle Medicine recommendations according to [facility]) were discussed and and offered to patient and she  agrees to start the  journey:  A. Whole Foods, Plant-Based Nutrition comprising of fruits and vegetables, plant-based proteins, whole-grain carbohydrates was discussed in detail  with the patient.   A list for source of those nutrients were also provided to the patient.  Patient will use only water or unsweetened tea for hydration. B.  The need to stay away from risky substances including alcohol, smoking; obtaining 7 to 9 hours of restorative sleep, at least 150 minutes of moderate intensity exercise weekly, the importance of healthy social connections,  and stress management techniques were discussed. C.  A full color page of  Calorie density of various food groups per pound showing examples of each food groups was provided to the patient.  She is advised to continue Synthroid 112 mcg p.o. daily before breakfast for hypothyroidism.   - I advised patient to maintain close follow up with Coral Spikes, DO for primary care needs.  I spent  26  minutes in the care of the patient today including review of labs from Thyroid Function, CMP, and other relevant labs ; imaging/biopsy records (current and previous including abstractions from other facilities); face-to-face time discussing  her lab results and symptoms, medications doses, her options of short and long term treatment based on the latest standards of care / guidelines;   and documenting the encounter.  Molli Hazard  participated in the discussions, expressed understanding, and voiced agreement with the above plans.  All questions were answered to her satisfaction. she is encouraged to contact clinic should she have any questions or concerns prior to her return visit.   Follow up plan: Return in about 6 months (around 12/10/2022) for Fasting Labs  in AM B4 8.   Glade Lloyd, MD [facility] [address] [place], [place] [address] Phone: [phone]  Fax: [phone]     06/11/2022, 10:16 AM  This note was  partially dictated with voice recognition software. Similar sounding words can be transcribed inadequately or may not  be corrected upon review.

## 2022-09-10 ENCOUNTER — Other Ambulatory Visit: Payer: Self-pay | Admitting: "Endocrinology

## 2022-10-28 ENCOUNTER — Encounter: Payer: Self-pay | Admitting: Nurse Practitioner

## 2022-10-28 ENCOUNTER — Ambulatory Visit: Payer: Medicare Other | Admitting: Nurse Practitioner

## 2022-10-28 ENCOUNTER — Ambulatory Visit (INDEPENDENT_AMBULATORY_CARE_PROVIDER_SITE_OTHER): Payer: Medicare Other | Admitting: Nurse Practitioner

## 2022-10-28 VITALS — BP 125/80 | HR 69 | Ht <= 58 in | Wt 167.0 lb

## 2022-10-28 DIAGNOSIS — Z0000 Encounter for general adult medical examination without abnormal findings: Secondary | ICD-10-CM

## 2022-10-28 DIAGNOSIS — E039 Hypothyroidism, unspecified: Secondary | ICD-10-CM

## 2022-10-28 DIAGNOSIS — Z23 Encounter for immunization: Secondary | ICD-10-CM | POA: Diagnosis not present

## 2022-10-28 DIAGNOSIS — Z01419 Encounter for gynecological examination (general) (routine) without abnormal findings: Secondary | ICD-10-CM

## 2022-10-28 DIAGNOSIS — Z0001 Encounter for general adult medical examination with abnormal findings: Secondary | ICD-10-CM

## 2022-10-28 NOTE — Progress Notes (Unsigned)
Subjective:    Patient ID: Sandra Zavala, female    DOB: 08/13/1955, 67 y.o.   MRN: [id number]  HPI AWV- Annual Wellness Visit  The patient was seen for their annual wellness visit. The patient's past medical history, surgical history, and family history were reviewed. Pertinent vaccines were reviewed ( tetanus, pneumonia, shingles, flu) The patient's medication list was reviewed and updated.  The height and weight were entered.  BMI recorded in electronic record elsewhere  Cognitive screening was completed. Outcome of Mini - Cog: 5   Falls /depression screening electronically recorded within record elsewhere  Current tobacco usage:no (All patients who use tobacco were given written and verbal information on quitting)  Recent listing of emergency department/hospitalizations over the past year were reviewed.  current specialist the patient sees on a regular basis:    Medicare annual wellness visit patient questionnaire was reviewed.  A written screening schedule for the patient for the next 5-10 years was given. Appropriate discussion of followup regarding next visit was discussed. Doing much better with healthy diet. Seeing nutritionist at chiropractor in [place]. Has lost about 10 lbs. Walking about 2 miles 5 x per week. Married, same sexual partner. No vaginal bleeding or pelvic pain.  Regular vision and dental exams.    Review of Systems  Constitutional:  Negative for activity change, appetite change and fatigue.  HENT:  Negative for sore throat and trouble swallowing.   Respiratory:  Negative for cough, chest tightness, shortness of breath and wheezing.   Cardiovascular:  Negative for chest pain.  Gastrointestinal:  Negative for abdominal distention, abdominal pain, constipation, diarrhea, nausea and vomiting.  Genitourinary:  Negative for difficulty urinating, dysuria, enuresis, frequency, genital sores, pelvic pain, urgency, vaginal bleeding and vaginal discharge.        Denies rash, irritation or lesions in the vulvar area.       10/28/2022    2:03 PM  Depression screen PHQ 2/9  Decreased Interest 0  Down, Depressed, Hopeless 0  PHQ - 2 Score 0  Altered sleeping 0  Tired, decreased energy 0  Change in appetite 0  Feeling bad or failure about yourself  0  Trouble concentrating 0  Moving slowly or fidgety/restless 0  Suicidal thoughts 0  PHQ-9 Score 0      10/28/2022    2:03 PM 05/01/2022    1:33 PM  GAD 7 : Generalized Anxiety Score  Nervous, Anxious, on Edge 0 0  Control/stop worrying 0 0  Worry too much - different things 0 0  Trouble relaxing 0 0  Restless 0 0  Easily annoyed or irritable 0 0  Afraid - awful might happen 0 0  Total GAD 7 Score 0 0  Anxiety Difficulty  Not difficult at all   Levothyroxine dose was adjusted after last labs in January. Due for repeat lab.       Objective:   Physical Exam Vitals and nursing note reviewed.  Constitutional:      General: She is not in acute distress.    Appearance: She is well-developed.  Neck:     Thyroid: No thyromegaly.     Trachea: No tracheal deviation.     Comments: Thyroid non tender to palpation. No mass or goiter noted.  Cardiovascular:     Rate and Rhythm: Normal rate and regular rhythm.     Heart sounds: Normal heart sounds. No murmur heard. Pulmonary:     Effort: Pulmonary effort is normal.     Breath sounds: Normal  breath sounds.  Chest:  Breasts:    Right: No swelling, inverted nipple, mass, skin change or tenderness.     Left: No swelling, inverted nipple, mass, skin change or tenderness.  Abdominal:     General: There is no distension.     Palpations: Abdomen is soft.     Tenderness: There is no abdominal tenderness.  Genitourinary:    Comments: Defers GU and pelvic exams. Denies any problems.  Musculoskeletal:     Cervical back: Normal range of motion and neck supple.  Lymphadenopathy:     Cervical: No cervical adenopathy.     Upper Body:     Right upper  body: No supraclavicular, axillary or pectoral adenopathy.     Left upper body: No supraclavicular, axillary or pectoral adenopathy.  Skin:    General: Skin is warm and dry.     Findings: No rash.  Neurological:     Mental Status: She is alert and oriented to person, place, and time.  Psychiatric:        Mood and Affect: Mood normal.        Behavior: Behavior normal.        Thought Content: Thought content normal.        Judgment: Judgment normal.    Today's Vitals   10/28/22 1358  BP: 125/80  Pulse: 69  SpO2: 97%  Weight: 167 lb (75.8 kg)  Height: 4\' 10"  (1.473 m)   Body mass index is 34.9 kg/m.  The 10-year ASCVD risk score (Arnett DK, et al., 2019) is: 4.9%   Values used to calculate the score:     Age: 65 years     Sex: Female     Is Non-Hispanic African American: No     Diabetic: No     Tobacco smoker: No     Systolic Blood Pressure: 125 mmHg     Is BP treated: No     HDL Cholesterol: 59 mg/dL     Total Cholesterol: 179 mg/dL  See labs 1/61/09.       Assessment & Plan:   Problem List Items Addressed This Visit       Endocrine   Hypothyroidism   Relevant Orders   T4, free (Completed)   TSH (Completed)   Other Visit Diagnoses     Encounter for Medicare annual wellness exam    -  Primary   Well woman exam       Relevant Orders   Pneumococcal conjugate vaccine 20-valent (Prevnar 20) (Completed)   Td : Tetanus/diphtheria >7yo Preservative  free (Completed)   Need for vaccination       Relevant Orders   Pneumococcal conjugate vaccine 20-valent (Prevnar 20) (Completed)   Td : Tetanus/diphtheria >7yo Preservative  free (Completed)      Thyroid labs pending.  Tetanus vaccine and Prevnar 20 today. Continue healthy diet, weight loss and activity.  Will look into alternatives to discuss other osteoporosis treatments.  Return in about 1 year (around 10/28/2023) for physical.

## 2022-10-29 ENCOUNTER — Encounter: Payer: Self-pay | Admitting: Nurse Practitioner

## 2022-10-29 LAB — TSH: TSH: 1.27 u[IU]/mL (ref 0.450–4.500)

## 2022-10-29 LAB — T4, FREE: Free T4: 1.34 ng/dL (ref 0.82–1.77)

## 2022-11-08 ENCOUNTER — Other Ambulatory Visit (HOSPITAL_COMMUNITY): Payer: Self-pay | Admitting: Nurse Practitioner

## 2022-11-08 DIAGNOSIS — Z1231 Encounter for screening mammogram for malignant neoplasm of breast: Secondary | ICD-10-CM

## 2022-12-10 ENCOUNTER — Encounter: Payer: Self-pay | Admitting: "Endocrinology

## 2022-12-10 ENCOUNTER — Ambulatory Visit (INDEPENDENT_AMBULATORY_CARE_PROVIDER_SITE_OTHER): Payer: Medicare Other | Admitting: "Endocrinology

## 2022-12-10 ENCOUNTER — Encounter: Payer: Self-pay | Admitting: Nurse Practitioner

## 2022-12-10 VITALS — BP 112/70 | HR 68 | Ht <= 58 in | Wt 165.0 lb

## 2022-12-10 DIAGNOSIS — M810 Age-related osteoporosis without current pathological fracture: Secondary | ICD-10-CM

## 2022-12-10 DIAGNOSIS — E782 Mixed hyperlipidemia: Secondary | ICD-10-CM

## 2022-12-10 DIAGNOSIS — E039 Hypothyroidism, unspecified: Secondary | ICD-10-CM

## 2022-12-10 NOTE — Progress Notes (Signed)
Endocrinology follow-up note                                                           12/10/2022       Past Medical History:  Diagnosis Date   Hypothyroid    Osteoporosis    Past Surgical History:  Procedure Laterality Date   BIOPSY  01/02/2021   Procedure: BIOPSY;  Surgeon: Lanelle Bal, DO;  Location: [facility];  Service: Endoscopy;;   CHOLECYSTECTOMY  1999   COLONOSCOPY N/A 06/26/2017   recto-sigmoid colon and sigmoid colon moderately redundant, multiple small and large-mouthed diverticula in recto-sigmoid, sigmoid, descending colon and splenic flexure.   ESOPHAGOGASTRODUODENOSCOPY (EGD) WITH PROPOFOL N/A 01/02/2021   small hiatal hernia, gastritis s/p biopsy (negative H.pylori, benign path), normal duodenum   TONSILECTOMY, ADENOIDECTOMY, BILATERAL MYRINGOTOMY AND TUBES  1962   Social History   Socioeconomic History   Marital status: Married    Spouse name: Not on file   Number of children: Not on file   Years of education: Not on file   Highest education level: Not on file  Occupational History   Not on file  Tobacco Use   Smoking status: Never   Smokeless tobacco: Never  Vaping Use   Vaping status: Never Used  Substance and Sexual Activity   Alcohol use: No   Drug use: No   Sexual activity: Yes  Other Topics Concern   Not on file  Social History Narrative   Not on file   Social Determinants of Health   Financial Resource Strain: Not on file  Food Insecurity: Not on file  Transportation Needs: Not on file  Physical Activity: Not on file  Stress: Not on file  Social Connections: Not on file   Outpatient Encounter Medications as of 12/10/2022  Medication Sig   Omega-3 Fatty Acids (OMEGA-3 FISH OIL PO) Take 1 capsule by mouth daily.   Red Yeast Rice Extract (RED YEAST RICE PO) Take 1 tablet by mouth daily.   alendronate (FOSAMAX) 70 MG tablet TAKE 1 TABLET BY MOUTH EVERY 7 DAYS. TAKE WITH A FULL GLASS OF WATER ON AN EMPTY STOMACH   calcium  carbonate (OS-CAL) 600 MG TABS Take 1,200 mg by mouth daily with breakfast.   cholecalciferol (VITAMIN D) 25 MCG (1000 UNIT) tablet Take 2,000 Units by mouth daily.   ibuprofen (ADVIL,MOTRIN) 200 MG tablet Take 400-600 mg by mouth every 8 (eight) hours as needed for headache or mild pain.   Inulin (FIBER CHOICE PO) Take 3 tablets by mouth daily.   levothyroxine (SYNTHROID) 112 MCG tablet TAKE 1 TABLET (112 MCG TOTAL) BY MOUTH DAILY BEFORE BREAKFAST. TAKE 1 TABLET DAILY.   Magnesium Oxide 200 MG TABS Take 200 mg by mouth daily.   Misc Natural Products (GLUCOSAMINE CHOND MSM FORMULA PO) Take 1 tablet by mouth daily.   ondansetron (ZOFRAN ODT) 4 MG disintegrating tablet Take 1 tablet (4 mg total) by mouth every 8 (eight) hours as needed for nausea or vomiting.   Probiotic Product (PHILLIPS COLON HEALTH PO) Take 1 capsule by mouth daily.   triamcinolone (NASACORT) 55 MCG/ACT AERO nasal inhaler Place 2 sprays into the nose daily.   vitamin C (ASCORBIC ACID) 500 MG tablet Take 500 mg by mouth daily.   No facility-administered encounter  medications on file as of 12/10/2022.   ALLERGIES: No Known Allergies  VACCINATION STATUS: Immunization History  Administered Date(s) Administered   Fluad Quad(high Dose 65+) 02/25/2022   Influenza,inj,Quad PF,6+ Mos 02/25/2017   Influenza-Unspecified 02/03/2018, 02/02/2019   Janssen (J&J) SARS-COV-2 Vaccination 09/20/2019, 10/18/2019   PNEUMOCOCCAL CONJUGATE-20 10/28/2022   Pneumococcal Polysaccharide-23 10/13/2020   Td 10/28/2022   Zoster Recombinant(Shingrix) 01/05/2019, 02/04/2019     HPI   Sandra Zavala is 67 y.o. female who presents today for follow-up after she was seen in consultation for osteoporosis requested by  Tommie Sams, DO.  Patient was diagnosed with osteoporosis  approximately  10 years ago. She denies fractures or falls. No dizziness/vertigo/orthostasis.  I reviewed pt's DEXA scans: Progressively she has improved her bone density  utilizing alendronate 70 mg p.o. weekly.  She is also on regular dose of vitamin D 2000 units daily as well as calcium 1200 mg p.o. daily. She has lost some inches in height.  Her last bone density was in July 2023.  She has no interval falls nor fractures.  She also eats dairy and green, leafy, vegetables.   No weight bearing exercises.  She denies renal, hepatic, cardiac problems.  She has hypothyroidism on levothyroxine 112 mcg p.o. daily.  She also has hyperlipidemia not on statins.  After changing her diet to mostly whole food plant-based diet, she has lowered her LDL to 93.  No h/o hyper/hypocalcemia. No h/o hyperparathyroidism. No h/o kidney stones.  No h/o CKD. Last BUN/Cr:  Pt does have a FH of osteoporosis in her mother.   Review of Systems  Constitutional: no weight gain/loss, no fatigue, no subjective hyperthermia, no subjective hypothermia   Objective:    BP 112/70   Pulse 68   Ht 4\' 10"  (1.473 m)   Wt 165 lb (74.8 kg)   BMI 34.49 kg/m   Wt Readings from Last 3 Encounters:  12/10/22 165 lb (74.8 kg)  10/28/22 167 lb (75.8 kg)  06/11/22 170 lb (77.1 kg)    Physical Exam  Constitutional: BMI of 36.3, not in acute distress, normal state of mind Eyes: PERRLA, EOMI, no exophthalmos ENT: moist mucous membranes, no thyromegaly, no cervical lymphadenopathy Cardiovascular: normal precordial activity, Regular Rate and Rhythm, no Murmur/Rubs/Gallops   CMP ( most recent) CMP     Component Value Date/Time   NA 142 12/03/2022 0802   K 5.1 12/03/2022 0802   CL 105 12/03/2022 0802   CO2 25 12/03/2022 0802   GLUCOSE 98 12/03/2022 0802   GLUCOSE 95 06/16/2015 0819   BUN 16 12/03/2022 0802   CREATININE 0.62 12/03/2022 0802   CREATININE 0.55 06/16/2015 0819   CALCIUM 9.4 12/03/2022 0802   PROT 6.3 12/03/2022 0802   ALBUMIN 4.5 12/03/2022 0802   AST 19 12/03/2022 0802   ALT 21 12/03/2022 0802   ALKPHOS 74 12/03/2022 0802   BILITOT 0.3 12/03/2022 0802   GFRNONAA 95  10/06/2019 0803   GFRAA 110 10/06/2019 0803    Diabetic Labs (most recent): Lab Results  Component Value Date   HGBA1C 5.5 10/19/2021   HGBA1C 5.5 10/06/2020   HGBA1C 5.4 10/06/2019     Lipid Panel ( most recent) Lipid Panel     Component Value Date/Time   CHOL 162 12/03/2022 0802   TRIG 56 12/03/2022 0802   HDL 58 12/03/2022 0802   CHOLHDL 2.8 12/03/2022 0802   CHOLHDL 3.3 06/16/2015 0819   VLDL 13 06/16/2015 0819   LDLCALC 93 12/03/2022 0802  LABVLDL 11 12/03/2022 0802      Lab Results  Component Value Date   TSH 1.460 12/03/2022   TSH 1.270 10/28/2022   TSH 0.392 (L) 06/04/2022   TSH 0.557 10/19/2021   TSH 1.490 10/06/2020   TSH 1.800 10/06/2019   TSH 1.290 06/10/2018   TSH 1.190 06/18/2017   TSH 2.430 06/13/2016   TSH 1.070 08/17/2015   FREET4 1.55 12/03/2022   FREET4 1.34 10/28/2022   FREET4 1.58 06/04/2022     Bone density studies between 2013-2023 are reviewed as follows: [facility] Spine L1-L2 11/08/2021 65.0 Osteoporosis -2.5 0.863 g/cm2 3.9% - [facility] Spine L1-L2 11/01/2019 63.0 Osteoporosis -2.8 0.831 g/cm2 -1.0% - [facility] Spine L1-L2 06/20/2016 59.6 Osteoporosis -2.7 0.839 g/cm2 1.6% - [facility] Spine L1-L2 12/28/2013 57.1 Osteoporosis -2.8 0.826 g/cm2 2.4% - [facility] Spine L1-L2 06/27/2011 54.6 Osteoporosis -3.0 0.807 g/cm2 - -   DualFemur Neck Left 11/08/2021 65.0 Osteopenia -1.8 0.787 g/cm2 5.4% Yes DualFemur Neck Left 11/01/2019 63.0 Osteopenia -2.1 0.747 g/cm2 -3.9% - DualFemur Neck Left 06/20/2016 59.6 Osteopenia -1.9 0.777 g/cm2 1.8% - DualFemur Neck Left 12/28/2013 57.1 Osteopenia -2.0 0.763 g/cm2 -3.0% - DualFemur Neck Left 06/27/2011 54.6 Osteopenia -1.8 0.787 g/cm2 - -  Assessment: 1. Osteoporosis 2.  Hypocalcemia 3.  Hyperlipidemia 4.  Hypothyroidism  Plan: 1. 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  I explained that based on  the T scores, she has an increased risk for fractures.  She has benefited from Fosamax intervention over the years . Safety data for up to 10 years of treatment with this medication. - We discussed about the different medication classes, benefits and side effects (including atypical fractures and ONJ - no dental workup in progress or planned).   She will benefit from staying on this medication until her next bone density.  If she maintains her gains, she will be considered for drug holiday.  If she starts to lose bone mass, she will be considered for her next option of treatment either with Prolia or Forteo.  - we reviewed her dietary and supplemental calcium and vitamin D intake, which I believe are adequate. Pt ends up getting at least 2000 units vitamin D at least 1200 mg of calcium daily. I advised her to continue this - given her specific instructions about food sources for these - see pt instructions  - discussed fall precautions  .  Also, avoid smoking or >2 drinks of alcohol a day.  -Her next bone density was in July 2025. In light of her hyperlipidemia,  metabolic syndrome, and the fact that she would like to avoid statin intervention, she was given a packet of lifestyle medicine.  She presents with reasonable engagement with improvement in her lipid profile and transaminases.  - she acknowledges that there is a room for improvement in her food and drink choices. - Suggestion is made for her to avoid simple carbohydrates  from her diet including Cakes, Sweet Desserts, Ice Cream, Soda (diet and regular), Sweet Tea, Candies, Chips, Cookies, Store Bought Juices, Alcohol , Artificial Sweeteners,  Coffee Creamer, and "Sugar-free" Products, Lemonade. This will help patient to have more stable blood glucose profile and potentially avoid unintended weight gain.  The following Lifestyle Medicine recommendations according to American College of Lifestyle Medicine  [facility]) were discussed and and  offered to patient and she  agrees to start the journey:  A. Whole  Foods, Plant-Based Nutrition comprising of fruits and vegetables, plant-based proteins, whole-grain carbohydrates was discussed in detail with the patient.   A list for source of those nutrients were also provided to the patient.  Patient will use only water or unsweetened tea for hydration. B.  The need to stay away from risky substances including alcohol, smoking; obtaining 7 to 9 hours of restorative sleep, at least 150 minutes of moderate intensity exercise weekly, the importance of healthy social connections,  and stress management techniques were discussed. C.  A full color page of  Calorie density of various food groups per pound showing examples of each food groups was provided to the patient.  Her previsit thyroid function tests are consistent with appropriate replacement.  She is advised to continue Synthroid 112 mcg p.o. daily before breakfast.   - We discussed about the correct intake of her thyroid hormone, on empty stomach at fasting, with water, separated by at least 30 minutes from breakfast and other medications,  and separated by more than 4 hours from calcium, iron, multivitamins, acid reflux medications (PPIs). -Patient is made aware of the fact that thyroid hormone replacement is needed for life, dose to be adjusted by periodic monitoring of thyroid function tests.   - I advised patient to maintain close follow up with Tommie Sams, DO for primary care needs.   I spent  25  minutes in the care of the patient today including review of labs from Thyroid Function, CMP, and other relevant labs ; imaging/biopsy records (current and previous including abstractions from other facilities); face-to-face time discussing  her lab results and symptoms, medications doses, her options of short and long term treatment based on the latest standards of care / guidelines;   and documenting the encounter.  Herminio Heads   participated in the discussions, expressed understanding, and voiced agreement with the above plans.  All questions were answered to her satisfaction. she is encouraged to contact clinic should she have any questions or concerns prior to her return visit.   Follow up plan: Return in about 1 year (around 12/10/2023) for DXA Scan B4 NV, Fasting Labs  in AM B4 8.   Marquis Lunch, MD [facility] [address] [place], [place] [address] Phone: [phone]  Fax: [phone]     12/10/2022, 9:48 AM  This note was partially dictated with voice recognition software. Similar sounding words can be transcribed inadequately or may not  be corrected upon review.

## 2022-12-10 NOTE — Patient Instructions (Signed)
                                     Advice for Weight Management  -For most of us the best way to lose weight is by diet management. Generally speaking, diet management means consuming less calories intentionally which over time brings about progressive weight loss.  This can be achieved more effectively by avoiding ultra processed carbohydrates, processed meats, unhealthy fats.    It is critically important to know your numbers: how much calorie you are consuming and how much calorie you need. More importantly, our carbohydrates sources should be unprocessed naturally occurring  complex starch food items.  It is always important to balance nutrition also by  appropriate intake of proteins (mainly plant-based), healthy fats/oils, plenty of fruits and vegetables.   -The American College of Lifestyle Medicine (ACL M) recommends nutrition derived mostly from Whole Food, Plant Predominant Sources example an apple instead of applesauce or apple pie. Eat Plenty of vegetables, Mushrooms, fruits, Legumes, Whole Grains, Nuts, seeds in lieu of processed meats, processed snacks/pastries red meat, poultry, eggs.  Use only water or unsweetened tea for hydration.  The College also recommends the need to stay away from risky substances including alcohol, smoking; obtaining 7-9 hours of restorative sleep, at least 150 minutes of moderate intensity exercise weekly, importance of healthy social connections, and being mindful of stress and seek help when it is overwhelming.    -Sticking to a routine mealtime to eat 3 meals a day and avoiding unnecessary snacks is shown to have a big role in weight control. Under normal circumstances, the only time we burn stored energy is when we are hungry, so allow  some hunger to take place- hunger means no food between appropriate meal times, only water.  It is not advisable to starve.   -It is better to avoid simple carbohydrates including:  Cakes, Sweet Desserts, Ice Cream, Soda (diet and regular), Sweet Tea, Candies, Chips, Cookies, Store Bought Juices, Alcohol in Excess of  1-2 drinks a day, Lemonade,  Artificial Sweeteners, Doughnuts, Coffee Creamers, "Sugar-free" Products, etc, etc.  This is not a complete list.....    -Consulting with certified diabetes educators is proven to provide you with the most accurate and current information on diet.  Also, you may be  interested in discussing diet options/exchanges , we can schedule a visit with Sandra Zavala, RDN, CDE for individualized nutrition education.  -Exercise: If you are able: 30 -60 minutes a day ,4 days a week, or 150 minutes of moderate intensity exercise weekly.    The longer the better if tolerated.  Combine stretch, strength, and aerobic activities.  If you were told in the past that you have high risk for cardiovascular diseases, or if you are currently symptomatic, you may seek evaluation by your heart doctor prior to initiating moderate to intense exercise programs.                                  Additional Care Considerations for Diabetes/Prediabetes   -Diabetes  is a chronic disease.  The most important care consideration is regular follow-up with your diabetes care provider with the goal being avoiding or delaying its complications and to take advantage of advances in medications and technology.  If appropriate actions are taken early enough, type 2 diabetes can even be   reversed.  Seek information from the right source.  - Whole Food, Plant Predominant Nutrition is highly recommended: Eat Plenty of vegetables, Mushrooms, fruits, Legumes, Whole Grains, Nuts, seeds in lieu of processed meats, processed snacks/pastries red meat, poultry, eggs as recommended by American College of  Lifestyle Medicine (ACLM).  -Type 2 diabetes is known to coexist with other important comorbidities such as high blood pressure and high cholesterol.  It is critical to control not only the  diabetes but also the high blood pressure and high cholesterol to minimize and delay the risk of complications including coronary artery disease, stroke, amputations, blindness, etc.  The good news is that this diet recommendation for type 2 diabetes is also very helpful for managing high cholesterol and high blood blood pressure.  - Studies showed that people with diabetes will benefit from a class of medications known as ACE inhibitors and statins.  Unless there are specific reasons not to be on these medications, the standard of care is to consider getting one from these groups of medications at an optimal doses.  These medications are generally considered safe and proven to help protect the heart and the kidneys.    - People with diabetes are encouraged to initiate and maintain regular follow-up with eye doctors, foot doctors, dentists , and if necessary heart and kidney doctors.     - It is highly recommended that people with diabetes quit smoking or stay away from smoking, and get yearly  flu vaccine and pneumonia vaccine at least every 5 years.  See above for additional recommendations on exercise, sleep, stress management , and healthy social connections.      

## 2022-12-25 ENCOUNTER — Ambulatory Visit (HOSPITAL_COMMUNITY)
Admission: RE | Admit: 2022-12-25 | Discharge: 2022-12-25 | Disposition: A | Discharge status: Home / Self Care | Payer: Medicare Other | Source: Ambulatory Visit | Attending: Nurse Practitioner | Admitting: Nurse Practitioner

## 2022-12-25 DIAGNOSIS — Z1231 Encounter for screening mammogram for malignant neoplasm of breast: Secondary | ICD-10-CM | POA: Diagnosis present

## 2023-02-13 ENCOUNTER — Other Ambulatory Visit: Payer: Self-pay | Admitting: Nurse Practitioner

## 2023-02-13 ENCOUNTER — Other Ambulatory Visit: Payer: Self-pay | Admitting: Family Medicine

## 2023-02-13 ENCOUNTER — Encounter: Payer: Self-pay | Admitting: Nurse Practitioner

## 2023-02-13 MED ORDER — ALENDRONATE SODIUM 70 MG PO TABS: ORAL_TABLET | ORAL | 3 refills | Status: DC

## 2023-03-03 ENCOUNTER — Other Ambulatory Visit: Payer: Self-pay | Admitting: "Endocrinology

## 2023-05-05 ENCOUNTER — Ambulatory Visit
Admission: RE | Admit: 2023-05-05 | Discharge: 2023-05-05 | Disposition: A | Discharge status: Home / Self Care | Payer: Medicare Other | Source: Ambulatory Visit | Attending: Nurse Practitioner | Admitting: Nurse Practitioner

## 2023-05-05 VITALS — BP 135/84 | HR 80 | Temp 98.9°F | Resp 16

## 2023-05-05 DIAGNOSIS — J019 Acute sinusitis, unspecified: Secondary | ICD-10-CM | POA: Diagnosis not present

## 2023-05-05 MED ORDER — AMOXICILLIN-POT CLAVULANATE 875-125 MG PO TABS: 1.0000 | ORAL_TABLET | Freq: Two times a day (BID) | ORAL | 0 refills | Status: DC

## 2023-05-05 NOTE — Discharge Instructions (Addendum)
You have been diagnosed with a sinus infection.  You have been prescribed Augmentin 875 1 tablet twice daily for 7 days.  You are encouraged to complete the prescription as prescribed.  You are encouraged to continue with the Mucinex D that is previously taken.  Remember to hydrate well with water at least 2 L/day.  Continue to treat any additional symptoms with over-the-counter medications as they arrive.  Tylenol or ibuprofen for fever, pain and remember not to exceed the recommended daily dose.  If your symptoms get worse you are encouraged to follow-up with primary care for further evaluation.

## 2023-05-05 NOTE — ED Provider Notes (Signed)
[facility]    CSN: [id number] Arrival date & time: 05/05/23  1435      History   Chief Complaint Chief Complaint  Patient presents with   Facial Pain    HPI Sandra Zavala is a 67 y.o. female.   HPI She is in today for evaluation of nasal congestion.  She reports that she has been having right-sided ear, face, jaw pain for approximately 2 weeks.  She has been taking Mucinex along with Tylenol as directed.  She felt like the symptoms got worse last night she was unable to rest due to the pain in her jaw.  She endorses that she does have a sinus infection approximately once a year.  She denies any other has been sick.  She denies any dizziness, sore throat, shortness of breath, chest pains. Past Medical History:  Diagnosis Date   Hypothyroid    Osteoporosis     Patient Active Problem List   Diagnosis Date Noted   Breast pain, left 05/01/2022   Class 2 severe obesity due to excess calories with serious comorbidity and body mass index (BMI) of 36.0 to 36.9 in adult [facility]) 12/07/2021   Mixed hyperlipidemia 12/06/2021   Hypothyroidism 06/16/2017   Osteoporosis 12/05/2012    Past Surgical History:  Procedure Laterality Date   BIOPSY  01/02/2021   Procedure: BIOPSY;  Surgeon: Lanelle Bal, DO;  [facility];  Service: Endoscopy;;   CHOLECYSTECTOMY  1999   COLONOSCOPY N/A 06/26/2017   recto-sigmoid colon and sigmoid colon moderately redundant, multiple small and large-mouthed diverticula in recto-sigmoid, sigmoid, descending colon and splenic flexure.   ESOPHAGOGASTRODUODENOSCOPY (EGD) WITH PROPOFOL N/A 01/02/2021   small hiatal hernia, gastritis s/p biopsy (negative H.pylori, benign path), normal duodenum   TONSILECTOMY, ADENOIDECTOMY, BILATERAL MYRINGOTOMY AND TUBES  1962    OB History   No obstetric history on file.      Home Medications    Prior to Admission medications   Medication Sig Start Date End Date Taking? Authorizing Provider   alendronate (FOSAMAX) 70 MG tablet TAKE 1 TABLET BY MOUTH EVERY 7 DAYS. TAKE WITH A FULL GLASS OF WATER ON AN EMPTY STOMACH 02/13/23  Yes Cook, Jayce G, DO  amoxicillin-clavulanate (AUGMENTIN) 875-125 MG tablet Take 1 tablet by mouth every 12 (twelve) hours. 05/05/23  Yes Barbette Merino, NP  calcium carbonate (OS-CAL) 600 MG TABS Take 1,200 mg by mouth daily with breakfast.   Yes [provider]  cholecalciferol (VITAMIN D) 25 MCG (1000 UNIT) tablet Take 2,000 Units by mouth daily.   Yes [provider]  ibuprofen (ADVIL,MOTRIN) 200 MG tablet Take 400-600 mg by mouth every 8 (eight) hours as needed for headache or mild pain.   Yes [provider]  Inulin (FIBER CHOICE PO) Take 3 tablets by mouth daily.   Yes [provider]  levothyroxine (SYNTHROID) 112 MCG tablet TAKE 1 TABLET (112 MCG TOTAL) BY MOUTH DAILY BEFORE BREAKFAST 03/03/23  Yes Nida, Denman George, MD  Magnesium Oxide 200 MG TABS Take 200 mg by mouth daily.   Yes [provider]  Misc Natural Products (GLUCOSAMINE CHOND MSM FORMULA PO) Take 1 tablet by mouth daily.   Yes [provider]  Omega-3 Fatty Acids (OMEGA-3 FISH OIL PO) Take 1 capsule by mouth daily.   Yes [provider]  Probiotic Product (PHILLIPS COLON HEALTH PO) Take 1 capsule by mouth daily.   Yes [provider]  Red Yeast Rice Extract (RED YEAST RICE  PO) Take 1 tablet by mouth daily.   Yes [provider]  triamcinolone (NASACORT) 55 MCG/ACT AERO nasal inhaler Place 2 sprays into the nose daily.   Yes [provider]  vitamin C (ASCORBIC ACID) 500 MG tablet Take 500 mg by mouth daily.   Yes [provider]  ondansetron (ZOFRAN ODT) 4 MG disintegrating tablet Take 1 tablet (4 mg total) by mouth every 8 (eight) hours as needed for nausea or vomiting. 10/14/20   Campbell Riches, NP    Family History Family History  Problem Relation Age of Onset   Thyroid disease  Mother    Osteoporosis Mother    Heart disease Mother 61       stent   Pancreatic cancer Father        unknown if primary pancreatic vs liver   Liver cancer Father        unknown if primary pancreatic vs liver   Colon cancer Neg Hx    Gastric cancer Neg Hx    Esophageal cancer Neg Hx    Colon polyps Neg Hx     Social History Social History   Tobacco Use   Smoking status: Never   Smokeless tobacco: Never  Vaping Use   Vaping status: Never Used  Substance Use Topics   Alcohol use: No   Drug use: No     Allergies   Patient has no known allergies.   Review of Systems Review of Systems   Physical Exam Triage Vital Signs ED Triage Vitals [05/05/23 1440]  Encounter Vitals Group     BP 135/84     Systolic BP Percentile      Diastolic BP Percentile      Pulse Rate 80     Resp 16     Temp 98.9 F (37.2 C)     Temp Source Oral     SpO2 96 %     Weight      Height      Head Circumference      Peak Flow      Pain Score 2     Pain Loc      Pain Education      Exclude from Growth Chart    No data found.  Updated Vital Signs BP 135/84 (BP Location: Right Arm)   Pulse 80   Temp 98.9 F (37.2 C) (Oral)   Resp 16   SpO2 96%   Visual Acuity Right Eye Distance:   Left Eye Distance:   Bilateral Distance:    Right Eye Near:   Left Eye Near:    Bilateral Near:     Physical Exam Constitutional:      General: She is not in acute distress.    Appearance: She is obese.  HENT:     Head: Normocephalic and atraumatic.     Right Ear: Tympanic membrane normal.     Left Ear: Tympanic membrane normal.     Nose: Congestion present.     Mouth/Throat:     Mouth: Mucous membranes are dry.     Pharynx: No oropharyngeal exudate or posterior oropharyngeal erythema.  Cardiovascular:     Rate and Rhythm: Normal rate and regular rhythm.     Pulses: Normal pulses.     Heart sounds: Normal heart sounds.  Pulmonary:     Effort: Pulmonary effort is normal.     Breath  sounds: Normal breath sounds.  Musculoskeletal:        General: Normal range  of motion.     Cervical back: Normal range of motion.  Skin:    General: Skin is warm.     Capillary Refill: Capillary refill takes less than 2 seconds.  Neurological:     General: No focal deficit present.     Mental Status: She is alert and oriented to person, place, and time.  Psychiatric:        Mood and Affect: Mood normal.        Behavior: Behavior normal.      UC Treatments / Results  Labs (all labs ordered are listed, but only abnormal results are displayed) Labs Reviewed - No data to display  EKG   Radiology No results found.  Procedures Procedures (including critical care time)  Medications Ordered in UC Medications - No data to display  Initial Impression / Assessment and Plan / UC Course  I have reviewed the triage vital signs and the nursing notes.  Pertinent labs & imaging results that were available during my care of the patient were reviewed by me and considered in my medical decision making (see chart for details).     Facial pain Final Clinical Impressions(s) / UC Diagnoses   Final diagnoses:  Acute non-recurrent sinusitis, unspecified location     Discharge Instructions      You have been diagnosed with a sinus infection.  You have been prescribed Augmentin 875 1 tablet twice daily for 7 days.  You are encouraged to complete the prescription as prescribed.  You are encouraged to continue with the Mucinex D that is previously taken.  Remember to hydrate well with water at least 2 L/day.  Continue to treat any additional symptoms with over-the-counter medications as they arrive.  Tylenol or ibuprofen for fever, pain and remember not to exceed the recommended daily dose.  If your symptoms get worse you are encouraged to follow-up with primary care for further evaluation.     ED Prescriptions     Medication Sig Dispense Auth. Provider   amoxicillin-clavulanate  (AUGMENTIN) 875-125 MG tablet Take 1 tablet by mouth every 12 (twelve) hours. 14 tablet Barbette Merino, NP      PDMP not reviewed this encounter.   Thad Ranger [place], [place] 05/05/23 [phone]

## 2023-05-05 NOTE — ED Triage Notes (Signed)
Sinus pain and pressure, chills, ear pain on right side down to jaw, cough x 2 weeks. Taking mucinex, tylenol.

## 2023-06-16 ENCOUNTER — Telehealth: Payer: Medicare Other | Admitting: Physician Assistant

## 2023-06-16 DIAGNOSIS — B9689 Other specified bacterial agents as the cause of diseases classified elsewhere: Secondary | ICD-10-CM | POA: Diagnosis not present

## 2023-06-16 DIAGNOSIS — J019 Acute sinusitis, unspecified: Secondary | ICD-10-CM

## 2023-06-16 MED ORDER — AMOXICILLIN-POT CLAVULANATE 875-125 MG PO TABS: 1.0000 | ORAL_TABLET | Freq: Two times a day (BID) | ORAL | 0 refills | Status: DC

## 2023-06-16 NOTE — Progress Notes (Signed)

## 2023-09-23 ENCOUNTER — Ambulatory Visit (INDEPENDENT_AMBULATORY_CARE_PROVIDER_SITE_OTHER): Admitting: Otolaryngology

## 2023-09-23 VITALS — BP 123/81 | HR 71

## 2023-09-23 DIAGNOSIS — H903 Sensorineural hearing loss, bilateral: Secondary | ICD-10-CM

## 2023-09-23 DIAGNOSIS — H9313 Tinnitus, bilateral: Secondary | ICD-10-CM

## 2023-09-23 DIAGNOSIS — R0981 Nasal congestion: Secondary | ICD-10-CM

## 2023-09-23 DIAGNOSIS — J310 Chronic rhinitis: Secondary | ICD-10-CM

## 2023-09-23 DIAGNOSIS — J343 Hypertrophy of nasal turbinates: Secondary | ICD-10-CM

## 2023-09-23 DIAGNOSIS — R040 Epistaxis: Secondary | ICD-10-CM

## 2023-09-24 DIAGNOSIS — J310 Chronic rhinitis: Secondary | ICD-10-CM | POA: Insufficient documentation

## 2023-09-24 DIAGNOSIS — R040 Epistaxis: Secondary | ICD-10-CM | POA: Insufficient documentation

## 2023-09-24 DIAGNOSIS — H903 Sensorineural hearing loss, bilateral: Secondary | ICD-10-CM | POA: Insufficient documentation

## 2023-09-24 DIAGNOSIS — H9313 Tinnitus, bilateral: Secondary | ICD-10-CM | POA: Insufficient documentation

## 2023-09-24 NOTE — Progress Notes (Signed)
 Patient ID: Sandra Zavala, female   DOB: [birth year], 68 y.o.   MRN: [id number]  New complaint: Recurrent sinusitis  HPI: The patient is a 68 year old female who presents today complaining of recurrent sinusitis since January 2025.  She has had 2 episodes of sinusitis since the beginning of the year.  She was treated with antibiotics.  In addition, she also complains of intermittent minor epistaxis.  The patient was previously seen for bilateral sensorineural hearing loss and tinnitus.  She denies any change in her hearing today.  Currently she denies any facial pain, fever, or visual change.  Exam: General: Communicates without difficulty, well nourished, no acute distress. Head: Normocephalic, no evidence injury, no tenderness, facial buttresses intact without stepoff. Face/sinus: No tenderness to palpation and percussion. Facial movement is normal and symmetric. Eyes: PERRL, EOMI. No scleral icterus, conjunctivae clear. Neuro: CN II exam reveals vision grossly intact.  No nystagmus at any point of gaze. Ears: Auricles well formed without lesions.  Ear canals are intact without mass or lesion.  No erythema or edema is appreciated.  The TMs are intact without fluid. Nose: External evaluation reveals normal support and skin without lesions.  Dorsum is intact.  Anterior rhinoscopy reveals congested mucosa over anterior aspect of inferior turbinates and intact septum.  No purulence noted. Oral:  Oral cavity and oropharynx are intact, symmetric, without erythema or edema.  Mucosa is moist without lesions. Neck: Full range of motion without pain.  There is no significant lymphadenopathy.  No masses palpable.  Thyroid  bed within normal limits to palpation.  Parotid glands and submandibular glands equal bilaterally without mass.  Trachea is midline. Neuro:  CN 2-12 grossly intact.   Assessment: 1.  Chronic rhinitis with nasal mucosal congestion and bilateral inferior turbinate hypertrophy. 2.  No purulent  drainage or active infection is noted today. 3.  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  Plan: 1.  The physical exam findings are reviewed with the patient. 2.  Flonase nasal spray as needed.  The proper timing of using the nasal spray is discussed. 3.  Nasal saline irrigation. 4.  Nasal ointment and humidifier during the winter months. 5.  The patient is encouraged to call with any questions or concerns.

## 2023-10-28 ENCOUNTER — Encounter: Payer: Self-pay | Admitting: Nurse Practitioner

## 2023-10-28 ENCOUNTER — Ambulatory Visit: Admitting: Nurse Practitioner

## 2023-10-28 VITALS — BP 116/70 | HR 58 | Temp 97.7°F | Wt 165.0 lb

## 2023-10-28 DIAGNOSIS — Z01419 Encounter for gynecological examination (general) (routine) without abnormal findings: Secondary | ICD-10-CM

## 2023-10-28 DIAGNOSIS — Z0000 Encounter for general adult medical examination without abnormal findings: Secondary | ICD-10-CM

## 2023-10-28 NOTE — Progress Notes (Signed)
 Subjective:    Patient ID: Sandra Zavala, female    DOB: February 26, 1956, 68 y.o.   MRN: [id number]  HPI  AWV- Annual Wellness Visit  The patient was seen for their annual wellness visit. The patient's past medical history, surgical history, and family history were reviewed. Pertinent vaccines were reviewed ( tetanus, pneumonia, shingles, flu) The patient's medication list was reviewed and updated.  The height and weight were entered.  BMI recorded in electronic record elsewhere  Cognitive screening was completed. Outcome of Mini - Cog: 5   Falls /depression screening electronically recorded within record elsewhere  Current tobacco usage:no (All patients who use tobacco were given written and verbal information on quitting)  Recent listing of emergency department/hospitalizations over the past year were reviewed.  current specialist the patient sees on a regular basis: endocrinology   Medicare annual wellness visit patient questionnaire was reviewed.  A written screening schedule for the patient for the next 5-10 years was given. Appropriate discussion of followup regarding next visit was discussed.  Married. Same female sexual partner. No vaginal bleeding since last PE.  Sees Dr Lenis for bone density, osteoporosis management and labs. Sees a dietician and working on healthy diet and weight loss.  Review of Systems  Constitutional:  Negative for activity change, appetite change and fatigue.  HENT:  Negative for sore throat and trouble swallowing.   Respiratory:  Negative for cough, chest tightness, shortness of breath and wheezing.   Cardiovascular:  Negative for chest pain.  Gastrointestinal:  Negative for abdominal distention, abdominal pain, constipation, diarrhea, nausea and vomiting.  Genitourinary:  Negative for difficulty urinating, dysuria, frequency, genital sores, pelvic pain, urgency, vaginal bleeding and vaginal discharge.       Minimal stress incontinence. Denies  vulvar pain, rash or itching.       10/28/2023    8:53 AM  Depression screen PHQ 2/9  Decreased Interest 0  Down, Depressed, Hopeless 0  PHQ - 2 Score 0  Altered sleeping 0  Tired, decreased energy 0  Change in appetite 0  Feeling bad or failure about yourself  0  Trouble concentrating 0  Moving slowly or fidgety/restless 0  Suicidal thoughts 0  PHQ-9 Score 0  Difficult doing work/chores Not difficult at all      10/28/2023    8:53 AM 10/28/2022    2:03 PM 05/01/2022    1:33 PM  GAD 7 : Generalized Anxiety Score  Nervous, Anxious, on Edge 0 0 0  Control/stop worrying 0 0 0  Worry too much - different things 0 0 0  Trouble relaxing 0 0 0  Restless 0 0 0  Easily annoyed or irritable 0 0 0  Afraid - awful might happen 0 0 0  Total GAD 7 Score 0 0 0  Anxiety Difficulty Not difficult at all  Not difficult at all    Social History   Tobacco Use   Smoking status: Never   Smokeless tobacco: Never  Vaping Use   Vaping status: Never Used  Substance Use Topics   Alcohol use: No   Drug use: No        Objective:   Physical Exam Vitals and nursing note reviewed.  Constitutional:      General: She is not in acute distress.    Appearance: She is well-developed.  Neck:     Thyroid : No thyromegaly.     Trachea: No tracheal deviation.     Comments: Thyroid  non tender to palpation. No mass or  goiter noted.  Cardiovascular:     Rate and Rhythm: Normal rate and regular rhythm.     Heart sounds: Normal heart sounds. No murmur heard. Pulmonary:     Effort: Pulmonary effort is normal.     Breath sounds: Normal breath sounds.  Chest:  Breasts:    Right: No swelling, inverted nipple, mass, skin change or tenderness.     Left: Inverted nipple present. No swelling, mass, skin change or tenderness.     Comments: Chronic inverted nipple left breast Abdominal:     General: There is no distension.     Palpations: Abdomen is soft.     Tenderness: There is no abdominal tenderness.   Genitourinary:    Comments: Defers GU/pelvic exam. Denies any problems.   Musculoskeletal:     Cervical back: Normal range of motion and neck supple.  Lymphadenopathy:     Cervical: No cervical adenopathy.     Upper Body:     Right upper body: No supraclavicular, axillary or pectoral adenopathy.     Left upper body: No supraclavicular, axillary or pectoral adenopathy.   Skin:    General: Skin is warm and dry.   Neurological:     Mental Status: She is alert and oriented to person, place, and time.   Psychiatric:        Mood and Affect: Mood normal.        Behavior: Behavior normal.        Thought Content: Thought content normal.        Judgment: Judgment normal.    Today's Vitals   10/28/23 0849  BP: 116/70  Pulse: (!) 58  Temp: 97.7 F (36.5 C)  Weight: 165 lb (74.8 kg)   Body mass index is 34.49 kg/m.         Assessment & Plan:  Encounter for Medicare annual wellness exam  Well woman exam Adult wellness-complete.wellness physical was conducted today. Importance of diet and exercise were discussed in detail.  Importance of stress reduction and healthy living were discussed. Continue follow up with local dietician.   In addition to this a discussion regarding safety was also covered.  We also reviewed over immunizations and gave recommendations regarding current immunization needed for age.   Follow up with Dr. Lenis for bone density and labs.  Return in about 1 year (around 10/27/2024) for physical.      Subjective:   Sandra Zavala is a 68 y.o. female who presents for Medicare Annual (Subsequent) preventive examination.  Visit Complete: In person  Patient Medicare AWV questionnaire was completed by the patient on 10/28/23; I have confirmed that all information answered by patient is correct and no changes since this date. AWV- Annual Wellness Visit        Objective:    Today's Vitals   10/28/23 0849  BP: 116/70  Pulse: (!) 58  Temp: 97.7 F  (36.5 C)  Weight: 165 lb (74.8 kg)   Body mass index is 34.49 kg/m.     01/02/2021    8:39 AM 06/26/2017   11:50 AM  Advanced Directives  Does Patient Have a Medical Advance Directive? No No   Would patient like information on creating a medical advance directive? No - Patient declined No - Patient declined      Data saved with a previous flowsheet row definition     Current Medications (verified) Outpatient Encounter Medications as of 10/28/2023  Medication Sig   alendronate  (FOSAMAX ) 70 MG tablet TAKE 1 TABLET BY  MOUTH EVERY 7 DAYS. TAKE WITH A FULL GLASS OF WATER  ON AN EMPTY STOMACH   amoxicillin -clavulanate (AUGMENTIN ) 875-125 MG tablet Take 1 tablet by mouth 2 (two) times daily.   calcium carbonate (OS-CAL) 600 MG TABS Take 1,200 mg by mouth daily with breakfast.   cholecalciferol (VITAMIN D ) 25 MCG (1000 UNIT) tablet Take 2,000 Units by mouth daily.   ibuprofen (ADVIL,MOTRIN) 200 MG tablet Take 400-600 mg by mouth every 8 (eight) hours as needed for headache or mild pain.   Inulin (FIBER CHOICE PO) Take 3 tablets by mouth daily.   levothyroxine  (SYNTHROID ) 112 MCG tablet TAKE 1 TABLET (112 MCG TOTAL) BY MOUTH DAILY BEFORE BREAKFAST   Magnesium Oxide 200 MG TABS Take 200 mg by mouth daily.   Misc Natural Products (GLUCOSAMINE CHOND MSM FORMULA PO) Take 1 tablet by mouth daily.   Omega-3 Fatty Acids (OMEGA-3 FISH OIL PO) Take 1 capsule by mouth daily.   ondansetron  (ZOFRAN  ODT) 4 MG disintegrating tablet Take 1 tablet (4 mg total) by mouth every 8 (eight) hours as needed for nausea or vomiting.   Probiotic Product (PHILLIPS COLON HEALTH PO) Take 1 capsule by mouth daily.   Red Yeast Rice Extract (RED YEAST RICE PO) Take 1 tablet by mouth daily.   triamcinolone  (NASACORT) 55 MCG/ACT AERO nasal inhaler Place 2 sprays into the nose daily.   vitamin C (ASCORBIC ACID) 500 MG tablet Take 500 mg by mouth daily.   No facility-administered encounter medications on file as of 10/28/2023.     Allergies (verified) Patient has no known allergies.   History: Past Medical History:  Diagnosis Date   Hypothyroid    Osteoporosis    Past Surgical History:  Procedure Laterality Date   BIOPSY  01/02/2021   Procedure: BIOPSY;  Surgeon: Cindie Carlin POUR, DO;  [facility];  Service: Endoscopy;;   CHOLECYSTECTOMY  1999   COLONOSCOPY N/A 06/26/2017   recto-sigmoid colon and sigmoid colon moderately redundant, multiple small and large-mouthed diverticula in recto-sigmoid, sigmoid, descending colon and splenic flexure.   ESOPHAGOGASTRODUODENOSCOPY (EGD) WITH PROPOFOL  N/A 01/02/2021   small hiatal hernia, gastritis s/p biopsy (negative H.pylori, benign path), normal duodenum   TONSILECTOMY, ADENOIDECTOMY, BILATERAL MYRINGOTOMY AND TUBES  1962   Family History  Problem Relation Age of Onset   Thyroid  disease Mother    Osteoporosis Mother    Heart disease Mother 45       stent   Pancreatic cancer Father        unknown if primary pancreatic vs liver   Liver cancer Father        unknown if primary pancreatic vs liver   Colon cancer Neg Hx    Gastric cancer Neg Hx    Esophageal cancer Neg Hx    Colon polyps Neg Hx    Tobacco Counseling Counseling given: No smoking or vaping  Patient Care Team: Cook, Jayce G, DO as PCP - General (Family Medicine) Dr. Lenis endocrinology  Indicate any recent Medical Services you may have received from other than [facility] providers in the past year (date may be approximate). None    Assessment:   This is a routine wellness examination for [place]. See above.  Hearing/Vision screen No results found.   Goals Addressed   See Above    Depression Screen    10/28/2023    8:53 AM 10/28/2022    2:03 PM 05/01/2022    1:33 PM 10/26/2021   10:03 AM 10/13/2020    9:54 AM 09/08/2020  1:31 PM 07/14/2020    2:23 PM  PHQ 2/9 Scores  PHQ - 2 Score 0 0 0 0 0 0 0  PHQ- 9 Score 0 0 0        Fall Risk: recorded elsewhere   Cognitive  Function: recorded elsewhere        Immunizations Immunization History  Administered Date(s) Administered   Fluad Quad(high Dose 65+) 02/25/2022   Influenza,inj,Quad PF,6+ Mos 02/25/2017   Influenza-Unspecified 02/03/2018, 02/02/2019   Janssen (J&J) SARS-COV-2 Vaccination 09/20/2019, 10/18/2019   PNEUMOCOCCAL CONJUGATE-20 10/28/2022   Pneumococcal Polysaccharide-23 10/13/2020   Td 10/28/2022   Zoster Recombinant(Shingrix) 01/05/2019, 02/04/2019    TDAP status: Up to date  Flu Vaccine status: Up to date  Pneumococcal vaccine status: Up to date  Covid-19 vaccine status: Declined, Education has been provided regarding the importance of this vaccine but patient still declined. Advised may receive this vaccine at local pharmacy or Health Dept.or vaccine clinic. Aware to provide a copy of the vaccination record if obtained from local pharmacy or Health Dept. Verbalized acceptance and understanding.  Qualifies for Shingles Vaccine? done  Zostavax completed No   Shingrix Completed?: Yes  Screening Tests Health Maintenance  Topic Date Due   COVID-19 Vaccine (4 - 2024-25 season) 11/13/2023 (Originally 01/05/2023)   DEXA SCAN  11/09/2023   INFLUENZA VACCINE  12/05/2023   MAMMOGRAM  12/25/2023   Medicare Annual Wellness (AWV)  10/27/2024   Colonoscopy  06/27/2027   DTaP/Tdap/Td (2 - Tdap) 10/27/2032   Pneumococcal Vaccine: 50+ Years  Completed   Hepatitis C Screening  Completed   Zoster Vaccines- Shingrix  Completed   Hepatitis B Vaccines  Aged Out   HPV VACCINES  Aged Out   Meningococcal B Vaccine  Aged Out    Health Maintenance  There are no preventive care reminders to display for this patient.   Colorectal cancer screening: Type of screening: Colonoscopy. Completed yes. Repeat every 10 years  Mammogram status: Completed yes. Repeat every year  Bone Density status: Completed yes. Results reflect: Bone density results: OSTEOPOROSIS. Repeat every 2 years.  Lung Cancer  Screening: (Low Dose CT Chest recommended if Age 60-80 years, 20 pack-year currently smoking OR have quit w/in 15years.) does not qualify.   Lung Cancer Screening Referral: NA  Additional Screening:  Hepatitis C Screening: does qualify; Completed yes  Vision Screening: Recommended annual ophthalmology exams for early detection of glaucoma and other disorders of the eye. Is the patient up to date with their annual eye exam?  Yes  Who is the provider or what is the name of the office in which the patient attends annual eye exams? Not provided If pt is not established with a provider, would they like to be referred to a provider to establish care? no.   Dental Screening: Recommended annual dental exams for proper oral hygiene    Community Resource Referral / Chronic Care Management: CRR required this visit?  No   CCM required this visit?  No     Plan:     I have personally reviewed and noted the following in the patient's chart:   Medical and social history Use of alcohol, tobacco or illicit drugs  Current medications and supplements including opioid prescriptions. Patient is not currently taking opioid prescriptions. Functional ability and status Nutritional status Physical activity Advanced directives List of other physicians Hospitalizations, surgeries, and ER visits in previous 12 months Vitals Screenings to include cognitive, depression, and falls Referrals and appointments  In addition, I have  reviewed and discussed with patient certain preventive protocols, quality metrics, and best practice recommendations. A written personalized care plan for preventive services as well as general preventive health recommendations were provided to patient.     Elveria JAYSON Quarry, NP   10/28/2023   After Visit Summary: (In Person-Printed) AVS printed and given to the patient

## 2023-11-12 ENCOUNTER — Ambulatory Visit (HOSPITAL_COMMUNITY)
Admission: RE | Admit: 2023-11-12 | Discharge: 2023-11-12 | Disposition: A | Discharge status: Home / Self Care | Payer: Medicare Other | Source: Ambulatory Visit | Attending: "Endocrinology | Admitting: "Endocrinology

## 2023-11-12 ENCOUNTER — Other Ambulatory Visit (HOSPITAL_COMMUNITY): Payer: Self-pay | Admitting: Nurse Practitioner

## 2023-11-12 DIAGNOSIS — Z1231 Encounter for screening mammogram for malignant neoplasm of breast: Secondary | ICD-10-CM

## 2023-11-12 DIAGNOSIS — M810 Age-related osteoporosis without current pathological fracture: Secondary | ICD-10-CM | POA: Insufficient documentation

## 2023-11-21 ENCOUNTER — Other Ambulatory Visit: Payer: Self-pay | Admitting: "Endocrinology

## 2023-12-01 ENCOUNTER — Telehealth: Payer: Self-pay | Admitting: "Endocrinology

## 2023-12-01 DIAGNOSIS — E782 Mixed hyperlipidemia: Secondary | ICD-10-CM

## 2023-12-01 DIAGNOSIS — E039 Hypothyroidism, unspecified: Secondary | ICD-10-CM

## 2023-12-01 NOTE — Telephone Encounter (Signed)
 Labs updated and sent to [facility].

## 2023-12-01 NOTE — Telephone Encounter (Signed)
 Pt needs labs updated

## 2023-12-06 LAB — COMPREHENSIVE METABOLIC PANEL WITH GFR
ALT: 34 IU/L — ABNORMAL HIGH (ref 0–32)
AST: 28 IU/L (ref 0–40)
Albumin: 4.3 g/dL (ref 3.9–4.9)
Alkaline Phosphatase: 71 IU/L (ref 44–121)
BUN/Creatinine Ratio: 24 (ref 12–28)
BUN: 18 mg/dL (ref 8–27)
Bilirubin Total: 0.4 mg/dL (ref 0.0–1.2)
CO2: 20 mmol/L (ref 20–29)
Calcium: 9 mg/dL (ref 8.7–10.3)
Chloride: 106 mmol/L (ref 96–106)
Creatinine, Ser: 0.75 mg/dL (ref 0.57–1.00)
Globulin, Total: 1.9 g/dL (ref 1.5–4.5)
Glucose: 93 mg/dL (ref 70–99)
Potassium: 5.1 mmol/L (ref 3.5–5.2)
Sodium: 143 mmol/L (ref 134–144)
Total Protein: 6.2 g/dL (ref 6.0–8.5)
eGFR: 87 mL/min/1.73 (ref >59)

## 2023-12-06 LAB — LIPID PANEL
Chol/HDL Ratio: 3.2 ratio (ref 0.0–4.4)
Cholesterol, Total: 190 mg/dL (ref 100–199)
HDL: 60 mg/dL (ref >39)
LDL Chol Calc (NIH): 116 mg/dL — ABNORMAL HIGH (ref 0–99)
Triglycerides: 78 mg/dL (ref 0–149)
VLDL Cholesterol Cal: 14 mg/dL (ref 5–40)

## 2023-12-06 LAB — TSH: TSH: 1.87 u[IU]/mL (ref 0.450–4.500)

## 2023-12-06 LAB — T4, FREE: Free T4: 1.23 ng/dL (ref 0.82–1.77)

## 2023-12-11 ENCOUNTER — Encounter: Payer: Self-pay | Admitting: "Endocrinology

## 2023-12-11 ENCOUNTER — Ambulatory Visit (INDEPENDENT_AMBULATORY_CARE_PROVIDER_SITE_OTHER): Payer: Medicare Other | Admitting: "Endocrinology

## 2023-12-11 VITALS — BP 122/68 | HR 64 | Ht 58.75 in | Wt 167.6 lb

## 2023-12-11 DIAGNOSIS — M810 Age-related osteoporosis without current pathological fracture: Secondary | ICD-10-CM | POA: Diagnosis not present

## 2023-12-11 DIAGNOSIS — E039 Hypothyroidism, unspecified: Secondary | ICD-10-CM | POA: Diagnosis not present

## 2023-12-11 DIAGNOSIS — E782 Mixed hyperlipidemia: Secondary | ICD-10-CM

## 2023-12-11 MED ORDER — ROSUVASTATIN CALCIUM 5 MG PO TABS: 5.0000 mg | ORAL_TABLET | Freq: Every day | ORAL | 1 refills | Status: DC

## 2023-12-11 NOTE — Progress Notes (Signed)
 Endocrinology follow-up note                                                           12/11/2023       Past Medical History:  Diagnosis Date   Hypothyroid    Osteoporosis    Past Surgical History:  Procedure Laterality Date   BIOPSY  01/02/2021   Procedure: BIOPSY;  Surgeon: Cindie Carlin POUR, DO;  Location: [facility];  Service: Endoscopy;;   CHOLECYSTECTOMY  1999   COLONOSCOPY N/A 06/26/2017   recto-sigmoid colon and sigmoid colon moderately redundant, multiple small and large-mouthed diverticula in recto-sigmoid, sigmoid, descending colon and splenic flexure.   ESOPHAGOGASTRODUODENOSCOPY (EGD) WITH PROPOFOL  N/A 01/02/2021   small hiatal hernia, gastritis s/p biopsy (negative H.pylori, benign path), normal duodenum   TONSILECTOMY, ADENOIDECTOMY, BILATERAL MYRINGOTOMY AND TUBES  1962   Social History   Socioeconomic History   Marital status: Married    Spouse name: Not on file   Number of children: Not on file   Years of education: Not on file   Highest education level: Not on file  Occupational History   Not on file  Tobacco Use   Smoking status: Never   Smokeless tobacco: Never  Vaping Use   Vaping status: Never Used  Substance and Sexual Activity   Alcohol use: No   Drug use: No   Sexual activity: Yes  Other Topics Concern   Not on file  Social History Narrative   Not on file   Social Drivers of Health   Financial Resource Strain: Not on file  Food Insecurity: Not on file  Transportation Needs: Not on file  Physical Activity: Not on file  Stress: Not on file  Social Connections: Not on file   Outpatient Encounter Medications as of 12/11/2023  Medication Sig   Cholecalciferol (VITAMIN D3) 125 MCG (5000 UT) CAPS Take 1 capsule by mouth daily.   Coenzyme Q10 (COQ10) 50 MG CAPS Take 1 capsule by mouth daily in the afternoon.   Magnesium Oxide 200 MG TABS Take 200 mg by mouth daily. (Patient taking differently: Take 400 mg by mouth daily.)    rosuvastatin  (CRESTOR ) 5 MG tablet Take 1 tablet (5 mg total) by mouth at bedtime.   amoxicillin -clavulanate (AUGMENTIN ) 875-125 MG tablet Take 1 tablet by mouth 2 (two) times daily. (Patient not taking: Reported on 12/11/2023)   calcium  carbonate (OS-CAL) 600 MG TABS Take 1,200 mg by mouth daily with breakfast.   ibuprofen (ADVIL,MOTRIN) 200 MG tablet Take 400-600 mg by mouth every 8 (eight) hours as needed for headache or mild pain.   Inulin (FIBER CHOICE PO) Take 3 tablets by mouth daily.   levothyroxine  (SYNTHROID ) 112 MCG tablet TAKE 1 TABLET (112 MCG TOTAL) BY MOUTH DAILY BEFORE BREAKFAST   Misc Natural Products (GLUCOSAMINE CHOND MSM FORMULA PO) Take 1 tablet by mouth daily.   Omega-3 Fatty Acids (OMEGA-3 FISH OIL PO) Take 1 capsule by mouth daily.   ondansetron  (ZOFRAN  ODT) 4 MG disintegrating tablet Take 1 tablet (4 mg total) by mouth every 8 (eight) hours as needed for nausea or vomiting. (Patient not taking: Reported on 12/11/2023)   Probiotic Product (PHILLIPS COLON HEALTH PO) Take 1 capsule by mouth daily.   triamcinolone  (NASACORT) 55 MCG/ACT AERO nasal inhaler Place 2  sprays into the nose daily.   vitamin C (ASCORBIC ACID) 500 MG tablet Take 500 mg by mouth daily.   [DISCONTINUED] alendronate  (FOSAMAX ) 70 MG tablet TAKE 1 TABLET BY MOUTH EVERY 7 DAYS. TAKE WITH A FULL GLASS OF WATER  ON AN EMPTY STOMACH   [DISCONTINUED] cholecalciferol (VITAMIN D ) 25 MCG (1000 UNIT) tablet Take 2,000 Units by mouth daily.   [DISCONTINUED] Red Yeast Rice Extract (RED YEAST RICE PO) Take 1 tablet by mouth daily.   No facility-administered encounter medications on file as of 12/11/2023.   ALLERGIES: No Known Allergies  VACCINATION STATUS: Immunization History  Administered Date(s) Administered   Fluad Quad(high Dose 65+) 02/25/2022   Influenza,inj,Quad PF,6+ Mos 02/25/2017   Influenza-Unspecified 02/03/2018, 02/02/2019   Janssen (J&J) SARS-COV-2 Vaccination 09/20/2019, 10/18/2019   PNEUMOCOCCAL  CONJUGATE-20 10/28/2022   Pneumococcal Polysaccharide-23 10/13/2020   Td 10/28/2022   Zoster Recombinant(Shingrix) 01/05/2019, 02/04/2019     HPI   Sandra Zavala is 68 y.o. female who presents today for follow-up after she was seen in consultation for osteoporosis requested by  Cook, Jayce G, DO.  Patient was diagnosed with osteoporosis  approximately  15years ago. She denies fractures or falls. No dizziness/vertigo/orthostasis.  I reviewed pt's DEXA scans: Progressively she has improved her bone density utilizing alendronate  70 mg p.o. weekly.   On her updated history today, she reports she has taken Fosamax  for approximately 15 years-from the time of her diagnosis. She is also on regular dose of vitamin D  2000 units daily as well as calcium  1200 mg p.o. daily. She has lost some 2-3 inches in height.  Her last bone density from November 12, 2023 showed significant improvement with most of her readings osteopenia range.  She has no interval falls nor fractures.  Specifically, she never had hip or spine fracture.  She is not on steroids. She also eats dairy and green, leafy, vegetables.   No h/o hyperparathyroidism. No h/o kidney stones. No weight bearing exercises.  She denies renal, hepatic, cardiac problems.  She has hypothyroidism on levothyroxine  112 mcg p.o. daily.  She also has hyperlipidemia not on statins.  Tried different herbal preparations including Red Yeast Rice.  She has partial engagement with lifestyle medicine nutrition, her lipid panel shows increasing LDL to 116.   Pt does have a FH of osteoporosis in her mother.   Review of Systems  Constitutional: no weight gain/loss, no fatigue, no subjective hyperthermia, no subjective hypothermia   Objective:    BP 122/68   Pulse 64   Ht 4' 10.75 (1.492 m)   Wt 167 lb 9.6 oz (76 kg)   BMI 34.14 kg/m   Wt Readings from Last 3 Encounters:  12/11/23 167 lb 9.6 oz (76 kg)  10/28/23 165 lb (74.8 kg)  12/10/22 165 lb (74.8 kg)     Physical Exam  Constitutional: BMI of 34.14,  not in acute distress, normal state of mind Eyes: PERRLA, EOMI, no exophthalmos ENT: moist mucous membranes, no thyromegaly, no cervical lymphadenopathy Cardiovascular: normal precordial activity, Regular Rate and Rhythm, no Murmur/Rubs/Gallops   CMP ( most recent) CMP     Component Value Date/Time   NA 143 12/05/2023 0806   K 5.1 12/05/2023 0806   CL 106 12/05/2023 0806   CO2 20 12/05/2023 0806   GLUCOSE 93 12/05/2023 0806   GLUCOSE 95 06/16/2015 0819   BUN 18 12/05/2023 0806   CREATININE 0.75 12/05/2023 0806   CREATININE 0.55 06/16/2015 0819   CALCIUM  9.0 12/05/2023 0806  PROT 6.2 12/05/2023 0806   ALBUMIN 4.3 12/05/2023 0806   AST 28 12/05/2023 0806   ALT 34 (H) 12/05/2023 0806   ALKPHOS 71 12/05/2023 0806   BILITOT 0.4 12/05/2023 0806   GFRNONAA 95 10/06/2019 0803   GFRAA 110 10/06/2019 0803    Diabetic Labs (most recent): Lab Results  Component Value Date   HGBA1C 5.5 10/19/2021   HGBA1C 5.5 10/06/2020   HGBA1C 5.4 10/06/2019     Lipid Panel ( most recent) Lipid Panel     Component Value Date/Time   CHOL 190 12/05/2023 0806   TRIG 78 12/05/2023 0806   HDL 60 12/05/2023 0806   CHOLHDL 3.2 12/05/2023 0806   CHOLHDL 3.3 06/16/2015 0819   VLDL 13 06/16/2015 0819   LDLCALC 116 (H) 12/05/2023 0806   LABVLDL 14 12/05/2023 0806      Lab Results  Component Value Date   TSH 1.870 12/05/2023   TSH 1.460 12/03/2022   TSH 1.270 10/28/2022   TSH 0.392 (L) 06/04/2022   TSH 0.557 10/19/2021   TSH 1.490 10/06/2020   TSH 1.800 10/06/2019   TSH 1.290 06/10/2018   TSH 1.190 06/18/2017   TSH 2.430 06/13/2016   FREET4 1.23 12/05/2023   FREET4 1.55 12/03/2022   FREET4 1.34 10/28/2022   FREET4 1.58 06/04/2022     Most recent bone density on November 12, 2023 TECHNIQUE: An axial (e.g., hips, spine) and/or appendicular (e.g., radius) exam was performed, as appropriate, using [employer], sport and exercise at [facility]. Images are obtained for bone mineral density measurement and are not obtained for diagnostic purposes. [id number]   Exclusions: L3-L4 due to degenerative changes.   COMPARISON:  11/08/2021   FINDINGS: Scan quality: Good.   LUMBAR SPINE (L1-L2): BMD (in g/cm2): 0.880   T-score: -2.4, Z-score: -0.8   Rate of change from previous exam: No significant rate of change from previous exam.   LEFT FEMORAL NECK:   BMD (in g/cm2): 0.799,  T-score: -1.7,  Z-score: -0.2   LEFT TOTAL HIP:   BMD (in g/cm2): 0.878   T-score: -1.0,  Z-score: 0.3   RIGHT FEMORAL NECK:   BMD (in g/cm2): 0.911   T-score: -0.9,  Z-score: 0.6   RIGHT TOTAL HIP:   BMD (in g/cm2): 0.955,  T-score: -0.4,  Z-score: 0.9   DUAL-FEMUR TOTAL MEAN:   Rate of change from previous exam: No significant rate of change from previous exam.   FRAX 10-YEAR PROBABILITY OF FRACTURE:   FRAX not reported as the patient is receiving bone building therapy.   IMPRESSION: Osteopenia based on BMD.   Fracture risk is unknown due to history of bone building therapy.   Assessment: 1. Osteoporosis 2.  Hypocalcemia 3.  Hyperlipidemia 4.  Hypothyroidism  Plan: 1. 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  She has benefited from Fosamax  intervention for years.  Considering the fact that she took alendronate  for 15 years, with recent femoral neck T-score greater than -2.5, no recent fragility fractures, overall her bone density showed stable or improved profile, no ongoing use of high-risk  make her a good candidate for drug holiday. This is an attempt to lower her risk of atypical femur fracture and osteonecrosis of the jaw from excessive suppression of bone remodeling/turnover.  She will be assessed with serial CTX, CMP in 6 months.  She will also be offered repeat bone density in a year. If she shows signs of  bone resorption, she will  be considered for her next option of treatment with Forteo or Prolia. -She will be considered for treatment if she develops fragility fractures, significant bone loss happens during her next studies, or becomes exposed to high risk medication such as steroids. - we reviewed her dietary and supplemental calcium  and vitamin D  intake, which I believe are adequate. Pt ends up getting at least 2000 units vitamin D  at least 1200 mg of calcium  daily. I advised her to continue this - given her specific instructions about food sources for these - see pt instructions  - discussed fall precautions  .  Also, avoid smoking or >2 drinks of alcohol a day.   In light of her hyperlipidemia,  metabolic syndrome, she did avoid statin intervention in the past.  She did not control her LDL, this time she is open for low-dose statin.  She has tried red yeast rice for which she did not benefit from.  She did have signs of transaminitis in the past.  I discussed and added Crestor  5 mg nightly.  Side effects and precaution discussed with her.     - she acknowledges that there is a room for improvement in her food and drink choices. - Suggestion is made for her to avoid simple carbohydrates  from her diet including Cakes, Sweet Desserts, Ice Cream, Soda (diet and regular), Sweet Tea, Candies, Chips, Cookies, Store Bought Juices, Alcohol , Artificial Sweeteners,  Coffee Creamer, and Sugar-free Products, Lemonade. This will help patient to have more stable blood glucose profile and potentially avoid unintended weight gain.  The following Lifestyle Medicine recommendations according to American College of Lifestyle Medicine  [facility]) were discussed and and offered to patient and she  agrees to start the journey:  A. Whole Foods, Plant-Based Nutrition comprising of fruits and vegetables, plant-based proteins, whole-grain carbohydrates was discussed in detail with the patient.   A list for source of those nutrients were also  provided to the patient.  Patient will use only water  or unsweetened tea for hydration. B.  The need to stay away from risky substances including alcohol, smoking; obtaining 7 to 9 hours of restorative sleep, at least 150 minutes of moderate intensity exercise weekly, the importance of healthy social connections,  and stress management techniques were discussed. C.  A full color page of  Calorie density of various food groups per pound showing examples of each food groups was provided to the patient.   Her previsit thyroid  function tests are consistent with appropriate replacement.  She is advised to continue Synthroid  112 mcg p.o. daily before breakfast.   - We discussed about the correct intake of her thyroid  hormone, on empty stomach at fasting, with water , separated by at least 30 minutes from breakfast and other medications,  and separated by more than 4 hours from calcium , iron, multivitamins, acid reflux medications (PPIs). -Patient is made aware of the fact that thyroid  hormone replacement is needed for life, dose to be adjusted by periodic monitoring of thyroid  function tests.    - I advised patient to maintain close follow up with Cook, Jayce G, DO for primary care needs.   I spent  26  minutes in the care of the patient today including review of labs from Thyroid  Function, CMP, and other relevant labs ; imaging/biopsy records (current and previous including abstractions from other facilities); face-to-face time discussing  her lab results and symptoms, medications doses, her options of short and long term treatment based on  the latest standards of care / guidelines;   and documenting the encounter.  Devere SHAUNNA Costa  participated in the discussions, expressed understanding, and voiced agreement with the above plans.  All questions were answered to her satisfaction. she is encouraged to contact clinic should she have any questions or concerns prior to her return visit.    Follow up  plan: Return in about 6 months (around 06/12/2024) for Fasting Labs  in AM B4 8.   Ranny Earl, MD [facility] [employer] [address] [place], [place] [address] Phone: [phone]  Fax: [phone]     12/11/2023, 9:55 AM  This note was partially dictated with voice recognition software. Similar sounding words can be transcribed inadequately or may not  be corrected upon review.

## 2023-12-24 ENCOUNTER — Other Ambulatory Visit: Payer: Self-pay | Admitting: Medical Genetics

## 2023-12-29 ENCOUNTER — Ambulatory Visit (HOSPITAL_COMMUNITY)
Admission: RE | Admit: 2023-12-29 | Discharge: 2023-12-29 | Disposition: A | Discharge status: Home / Self Care | Source: Ambulatory Visit | Attending: Nurse Practitioner | Admitting: Nurse Practitioner

## 2023-12-29 ENCOUNTER — Encounter (HOSPITAL_COMMUNITY): Payer: Self-pay

## 2023-12-29 DIAGNOSIS — Z1231 Encounter for screening mammogram for malignant neoplasm of breast: Secondary | ICD-10-CM | POA: Insufficient documentation

## 2024-01-06 ENCOUNTER — Other Ambulatory Visit

## 2024-01-06 DIAGNOSIS — Z006 Encounter for examination for normal comparison and control in clinical research program: Secondary | ICD-10-CM

## 2024-01-30 LAB — GENECONNECT MOLECULAR SCREEN: Genetic Analysis Overall Interpretation: NEGATIVE

## 2024-02-15 ENCOUNTER — Other Ambulatory Visit: Payer: Self-pay | Admitting: "Endocrinology

## 2024-03-09 ENCOUNTER — Ambulatory Visit: Payer: Self-pay

## 2024-03-09 NOTE — Telephone Encounter (Signed)
Noted patient scheduled

## 2024-03-09 NOTE — Telephone Encounter (Signed)
 FYI Only or Action Required?: FYI only for provider: appointment scheduled on 03/11/2024.  Patient was last seen in primary care on 10/28/2023 by Mauro Elveria BROCKS, NP.  Called Nurse Triage reporting Abdominal Pain.  Symptoms began about a month ago.  Interventions attempted: Prescription medications: cipro , flagyl .  Symptoms are: gradually worsening.  Triage Disposition: See Physician Within 24 Hours  Patient/caregiver understands and will follow disposition?: Yes  Copied from CRM #[id number]. Topic: Clinical - Red Word Triage >> Mar 09, 2024 10:44 AM Wess RAMAN wrote: Red Word that prompted transfer to Nurse Triage: Patient thinks she has diverticulitis and would like an antibiotic  Symptoms: Pain in lower abdomen, chills, Nausea,   Pharmacy: [id number] - [place], [place] - [address] AT [facility] [address] [place] [address] Phone: [phone] Fax: [phone] Hours: Not open 24 hours Reason for Disposition  [1] MILD pain (e.g., does not interfere with normal activities) AND [2] pain comes and goes (cramps) AND [3] present > 48 hours  (Exception: This same abdominal pain is a chronic symptom recurrent or ongoing AND present > 4 weeks.)  Answer Assessment - Initial Assessment Questions Took 5 days of cipro  and flagyl , went away for about a week Pain returned, 3 days of cipro  and flagyl , pain resolved for 5 days, now pain has returned   1. LOCATION: Where does it hurt?      Mid abdomen 2. RADIATION: Does the pain shoot anywhere else? (e.g., chest, back)     denies 3. ONSET: When did the pain begin? (e.g., minutes, hours or days ago)      October 5 after eating broccoli 4. SUDDEN: Gradual or sudden onset?     sudden 5. PATTERN Does the pain come and go, or is it constant?     Comes and goes 6. SEVERITY: How bad is the pain?  (e.g., Scale 1-10; mild, moderate, or severe)     mild 7. RECURRENT SYMPTOM: Have you ever had this type of  stomach pain before? If Yes, ask: When was the last time? and What happened that time?      Patient has a 6 year history of diverticulits 8. CAUSE: What do you think is causing the stomach pain? (e.g., gallstones, recent abdominal surgery)     diverticulitis 9. RELIEVING/AGGRAVATING FACTORS: What makes it better or worse? (e.g., antacids, bending or twisting motion, bowel movement)     Medication, flagyl , cipro  10. OTHER SYMPTOMS: Do you have any other symptoms? (e.g., back pain, diarrhea, fever, urination pain, vomiting)       Nausea, chills  Protocols used: Abdominal Pain - Female-A-AH

## 2024-03-11 ENCOUNTER — Ambulatory Visit: Payer: Self-pay | Admitting: Nurse Practitioner

## 2024-03-11 VITALS — BP 110/77 | HR 71 | Temp 98.1°F | Ht 58.75 in | Wt 169.0 lb

## 2024-03-11 DIAGNOSIS — R110 Nausea: Secondary | ICD-10-CM | POA: Diagnosis not present

## 2024-03-11 DIAGNOSIS — K5792 Diverticulitis of intestine, part unspecified, without perforation or abscess without bleeding: Secondary | ICD-10-CM | POA: Diagnosis not present

## 2024-03-11 MED ORDER — DOXYCYCLINE HYCLATE 100 MG PO TABS: 100.0000 mg | ORAL_TABLET | Freq: Two times a day (BID) | ORAL | 0 refills | Status: DC

## 2024-03-11 MED ORDER — ONDANSETRON 4 MG PO TBDP: 4.0000 mg | ORAL_TABLET | Freq: Three times a day (TID) | ORAL | 0 refills | Status: AC | PRN

## 2024-03-11 NOTE — Progress Notes (Signed)
   Subjective:    Patient ID: Sandra Zavala, female    DOB: 06/21/1955, 68 y.o.   MRN: [id number]  HPI Hx of diverticulitis  Flare up started oct 10 abd pain , nausea, chills, constipation   Review of Systems     Objective:   Physical Exam        Assessment & Plan:

## 2024-03-12 ENCOUNTER — Encounter: Payer: Self-pay | Admitting: Nurse Practitioner

## 2024-05-15 ENCOUNTER — Other Ambulatory Visit: Payer: Self-pay | Admitting: "Endocrinology

## 2024-06-03 ENCOUNTER — Other Ambulatory Visit: Payer: Self-pay | Admitting: "Endocrinology

## 2024-06-03 ENCOUNTER — Encounter: Payer: Self-pay | Admitting: Family Medicine

## 2024-06-03 ENCOUNTER — Ambulatory Visit: Admitting: Family Medicine

## 2024-06-03 VITALS — BP 142/78 | HR 98 | Temp 98.1°F | Ht 58.75 in | Wt 174.2 lb

## 2024-06-03 DIAGNOSIS — H9313 Tinnitus, bilateral: Secondary | ICD-10-CM

## 2024-06-03 NOTE — Patient Instructions (Signed)
 Referral placed to [facility].  Take care  Dr. Bluford

## 2024-06-03 NOTE — Progress Notes (Signed)
 "  Subjective:  Patient ID: Sandra Zavala, female    DOB: 12-05-1955  Age: 69 y.o. MRN: [id number]  CC:   Chief Complaint  Patient presents with   Tinnitus    Ringing in both ears since 2023 but started taking tylenol arthritis and noticed the ringing has gotten worse over the past two weeks. Some balance issues over the past two weeks as well.    HPI:  69 year old female presents for evaluation of the above.  Has had tinnitus since 2023.  Has been worsening as of late.  She has had prior ENT evaluation which was unremarkable.  She states that she pressed on her mastoid bone and felt that it changed the frequency of the sound.  She saw her chiropractor who thought it be best that she be seen.  It sounds like he was concerned about possible mastoiditis.  She denies any tenderness to the mastoid.  No redness or swelling.  She is concerned about the worsening of her tenderness and what she can do to help.  Patient Active Problem List   Diagnosis Date Noted   Chronic rhinitis 09/24/2023   Tinnitus of both ears 09/24/2023   Mixed hyperlipidemia 12/06/2021   Hypothyroidism 06/16/2017   Osteoporosis 12/05/2012    Social Hx   Social History   Socioeconomic History   Marital status: Married    Spouse name: Not on file   Number of children: Not on file   Years of education: Not on file   Highest education level: Not on file  Occupational History   Not on file  Tobacco Use   Smoking status: Never   Smokeless tobacco: Never  Vaping Use   Vaping status: Never Used  Substance and Sexual Activity   Alcohol use: No   Drug use: No   Sexual activity: Yes  Other Topics Concern   Not on file  Social History Narrative   Not on file   Social Drivers of Health   Tobacco Use: Low Risk (06/03/2024)   Patient History    Smoking Tobacco Use: Never    Smokeless Tobacco Use: Never    Passive Exposure: Not on file  Financial Resource Strain: Not on file  Food Insecurity: Not on file   Transportation Needs: Not on file  Physical Activity: Not on file  Stress: Not on file  Social Connections: Not on file  Depression (PHQ2-9): Low Risk (06/03/2024)   Depression (PHQ2-9)    PHQ-2 Score: 0  Alcohol Screen: Not on file  Housing: Not on file  Utilities: Not on file  Health Literacy: Not on file    Review of Systems Per HPI  Objective:  BP (!) 142/78   Pulse 98   Temp 98.1 F (36.7 C)   Ht 4' 10.75 (1.492 m)   Wt 174 lb 3.2 oz (79 kg)   SpO2 98%   BMI 35.48 kg/m      06/03/2024    1:53 PM 03/11/2024    1:14 PM 12/11/2023    8:53 AM  BP/Weight  Systolic BP 142 110 122  Diastolic BP 78 77 68  Wt. (Lbs) 174.2 169 167.6  BMI 35.48 kg/m2 34.42 kg/m2 34.14 kg/m2    Physical Exam Vitals and nursing note reviewed.  Constitutional:      General: She is not in acute distress.    Appearance: Normal appearance.  HENT:     Head: Normocephalic and atraumatic.     Right Ear: Ear canal and external ear normal.  Left Ear: Ear canal and external ear normal.     Ears:     Comments: Scarring noted to the left TM.  Normal right TM. Neurological:     Mental Status: She is alert.     Lab Results  Component Value Date   WBC 6.8 10/19/2021   HGB 15.6 10/19/2021   HCT 46.8 (H) 10/19/2021   PLT 257 10/19/2021   GLUCOSE 93 12/05/2023   CHOL 190 12/05/2023   TRIG 78 12/05/2023   HDL 60 12/05/2023   LDLCALC 116 (H) 12/05/2023   ALT 34 (H) 12/05/2023   AST 28 12/05/2023   NA 143 12/05/2023   K 5.1 12/05/2023   CL 106 12/05/2023   CREATININE 0.75 12/05/2023   BUN 18 12/05/2023   CO2 20 12/05/2023   TSH 1.870 12/05/2023   HGBA1C 5.5 10/19/2021     Assessment & Plan:  Tinnitus of both ears Assessment & Plan: Needs audiological examination.  No evidence of mastoiditis.   Referring to ear nose and throat.  Orders: -     Ambulatory referral to ENT   Jacqulyn Ahle DO [facility] "

## 2024-06-03 NOTE — Assessment & Plan Note (Signed)
 Needs audiological examination.  No evidence of mastoiditis.   Referring to ear nose and throat.

## 2024-06-11 LAB — COMPREHENSIVE METABOLIC PANEL WITH GFR
ALT: 28 [IU]/L (ref 0–32)
AST: 17 [IU]/L (ref 0–40)
Albumin: 4.4 g/dL (ref 3.9–4.9)
Alkaline Phosphatase: 65 [IU]/L (ref 49–135)
BUN/Creatinine Ratio: 20 (ref 12–28)
BUN: 13 mg/dL (ref 8–27)
Bilirubin Total: 0.4 mg/dL (ref 0.0–1.2)
CO2: 22 mmol/L (ref 20–29)
Calcium: 9.1 mg/dL (ref 8.7–10.3)
Chloride: 107 mmol/L — ABNORMAL HIGH (ref 96–106)
Creatinine, Ser: 0.65 mg/dL (ref 0.57–1.00)
Globulin, Total: 1.5 g/dL (ref 1.5–4.5)
Glucose: 91 mg/dL (ref 70–99)
Potassium: 4.1 mmol/L (ref 3.5–5.2)
Sodium: 143 mmol/L (ref 134–144)
Total Protein: 5.9 g/dL — ABNORMAL LOW (ref 6.0–8.5)
eGFR: 96 mL/min/{1.73_m2}

## 2024-06-11 LAB — LIPID PANEL
Chol/HDL Ratio: 2.4 ratio (ref 0.0–4.4)
Cholesterol, Total: 143 mg/dL (ref 100–199)
HDL: 59 mg/dL
LDL Chol Calc (NIH): 71 mg/dL (ref 0–99)
Triglycerides: 62 mg/dL (ref 0–149)
VLDL Cholesterol Cal: 13 mg/dL (ref 5–40)

## 2024-06-11 LAB — FIB-4 W/REFLEX TO ELF
FIB-4 Index: 0.97 (ref 0.00–2.67)
Platelets: 221 10*3/uL (ref 150–450)

## 2024-06-11 LAB — PROPEPTIDE TYPE I COLLAGEN: Propeptide Type I Collagen: 27.6 ng/mL (ref 10.4–97.8)

## 2024-06-11 LAB — TSH: TSH: 1.47 u[IU]/mL (ref 0.450–4.500)

## 2024-06-11 LAB — T4, FREE: Free T4: 1.42 ng/dL (ref 0.82–1.77)

## 2024-06-11 LAB — VITAMIN D 25 HYDROXY (VIT D DEFICIENCY, FRACTURES): Vit D, 25-Hydroxy: 82.1 ng/mL (ref 30.0–100.0)

## 2024-06-17 ENCOUNTER — Ambulatory Visit: Admitting: "Endocrinology
